# Patient Record
Sex: Female | Born: 1970 | Race: White | Hispanic: No | Marital: Single | Smoking: Never smoker
Health system: Southern US, Community
[De-identification: ages and names within clinical notes are randomized; demographics above are authoritative.]

## PROBLEM LIST (undated history)

## (undated) DIAGNOSIS — K589 Irritable bowel syndrome without diarrhea: Secondary | ICD-10-CM

## (undated) DIAGNOSIS — Z8739 Personal history of other diseases of the musculoskeletal system and connective tissue: Secondary | ICD-10-CM

## (undated) DIAGNOSIS — D649 Anemia, unspecified: Secondary | ICD-10-CM

## (undated) DIAGNOSIS — I359 Nonrheumatic aortic valve disorder, unspecified: Secondary | ICD-10-CM

## (undated) DIAGNOSIS — I341 Nonrheumatic mitral (valve) prolapse: Secondary | ICD-10-CM

## (undated) DIAGNOSIS — N809 Endometriosis, unspecified: Secondary | ICD-10-CM

## (undated) DIAGNOSIS — F419 Anxiety disorder, unspecified: Secondary | ICD-10-CM

## (undated) DIAGNOSIS — R000 Tachycardia, unspecified: Secondary | ICD-10-CM

## (undated) DIAGNOSIS — Z90710 Acquired absence of both cervix and uterus: Secondary | ICD-10-CM

## (undated) DIAGNOSIS — Z310 Encounter for reversal of previous sterilization: Secondary | ICD-10-CM

## (undated) DIAGNOSIS — C50919 Malignant neoplasm of unspecified site of unspecified female breast: Secondary | ICD-10-CM

## (undated) DIAGNOSIS — M1990 Unspecified osteoarthritis, unspecified site: Secondary | ICD-10-CM

## (undated) DIAGNOSIS — J309 Allergic rhinitis, unspecified: Secondary | ICD-10-CM

## (undated) DIAGNOSIS — I059 Rheumatic mitral valve disease, unspecified: Secondary | ICD-10-CM

## (undated) DIAGNOSIS — I493 Ventricular premature depolarization: Secondary | ICD-10-CM

## (undated) DIAGNOSIS — Q254 Congenital malformation of aorta unspecified: Secondary | ICD-10-CM

## (undated) DIAGNOSIS — G8929 Other chronic pain: Secondary | ICD-10-CM

## (undated) DIAGNOSIS — N946 Dysmenorrhea, unspecified: Secondary | ICD-10-CM

## (undated) DIAGNOSIS — F32A Depression, unspecified: Secondary | ICD-10-CM

## (undated) DIAGNOSIS — M7918 Myalgia, other site: Secondary | ICD-10-CM

## (undated) DIAGNOSIS — G43909 Migraine, unspecified, not intractable, without status migrainosus: Secondary | ICD-10-CM

## (undated) HISTORY — DX: Depression, unspecified: F32.A

## (undated) HISTORY — DX: Dysmenorrhea, unspecified: N94.6

## (undated) HISTORY — DX: Migraine, unspecified, not intractable, without status migrainosus: G43.909

## (undated) HISTORY — DX: Other chronic pain: G89.29

## (undated) HISTORY — DX: Personal history of other diseases of the musculoskeletal system and connective tissue: Z87.39

## (undated) HISTORY — DX: Anxiety disorder, unspecified: F41.9

## (undated) HISTORY — DX: Myalgia, other site: M79.18

## (undated) HISTORY — DX: Irritable bowel syndrome without diarrhea: K58.9

## (undated) HISTORY — DX: Unspecified osteoarthritis, unspecified site: M19.90

## (undated) HISTORY — PX: EXCISION OF ENDOMETRIOMA: SHX6473

## (undated) HISTORY — DX: Rheumatic mitral valve disease, unspecified: I05.9

## (undated) HISTORY — DX: Acquired absence of both cervix and uterus: Z90.710

## (undated) HISTORY — PX: ABDOMINAL HYSTERECTOMY: SHX81

## (undated) HISTORY — PX: ELBOW BURSA SURGERY: SHX615

## (undated) HISTORY — DX: Tachycardia, unspecified: R00.0

## (undated) HISTORY — DX: Malignant neoplasm of unspecified site of unspecified female breast: C50.919

## (undated) HISTORY — DX: Congenital malformation of aorta unspecified: Q25.40

## (undated) HISTORY — DX: Nonrheumatic mitral (valve) prolapse: I34.1

## (undated) HISTORY — DX: Nonrheumatic aortic valve disorder, unspecified: I35.9

## (undated) HISTORY — DX: Anemia, unspecified: D64.9

## (undated) HISTORY — DX: Ventricular premature depolarization: I49.3

## (undated) HISTORY — DX: Endometriosis, unspecified: N80.9

## (undated) HISTORY — DX: Encounter for reversal of previous sterilization: Z31.0

## (undated) HISTORY — DX: Allergic rhinitis, unspecified: J30.9

---

## 1996-01-12 HISTORY — PX: PLEURAL SCARIFICATION: SHX748

## 2000-07-29 ENCOUNTER — Emergency Department (HOSPITAL_COMMUNITY): Admission: EM | Admit: 2000-07-29 | Discharge: 2000-07-29 | Payer: Self-pay | Admitting: Emergency Medicine

## 2002-01-11 ENCOUNTER — Emergency Department (HOSPITAL_COMMUNITY): Admission: EM | Admit: 2002-01-11 | Discharge: 2002-01-11 | Payer: Self-pay | Admitting: Emergency Medicine

## 2003-07-11 ENCOUNTER — Emergency Department (HOSPITAL_COMMUNITY): Admission: EM | Admit: 2003-07-11 | Discharge: 2003-07-11 | Payer: Self-pay | Admitting: Emergency Medicine

## 2004-01-26 ENCOUNTER — Emergency Department (HOSPITAL_COMMUNITY): Admission: EM | Admit: 2004-01-26 | Discharge: 2004-01-26 | Payer: Self-pay | Admitting: Emergency Medicine

## 2004-05-31 ENCOUNTER — Emergency Department (HOSPITAL_COMMUNITY): Admission: EM | Admit: 2004-05-31 | Discharge: 2004-05-31 | Payer: Self-pay | Admitting: Family Medicine

## 2004-11-13 ENCOUNTER — Emergency Department (HOSPITAL_COMMUNITY): Admission: EM | Admit: 2004-11-13 | Discharge: 2004-11-13 | Payer: Self-pay | Admitting: Family Medicine

## 2004-11-26 ENCOUNTER — Emergency Department (HOSPITAL_COMMUNITY): Admission: EM | Admit: 2004-11-26 | Discharge: 2004-11-26 | Payer: Self-pay | Admitting: Emergency Medicine

## 2005-02-16 ENCOUNTER — Encounter: Payer: Self-pay | Admitting: Sports Medicine

## 2005-02-16 ENCOUNTER — Ambulatory Visit (HOSPITAL_COMMUNITY): Admission: RE | Admit: 2005-02-16 | Discharge: 2005-02-16 | Payer: Self-pay | Admitting: General Practice

## 2005-02-26 ENCOUNTER — Encounter: Admission: RE | Admit: 2005-02-26 | Discharge: 2005-02-26 | Payer: Self-pay | Admitting: Internal Medicine

## 2005-06-01 ENCOUNTER — Encounter: Payer: Self-pay | Admitting: Sports Medicine

## 2006-04-26 ENCOUNTER — Ambulatory Visit: Payer: Self-pay | Admitting: Sports Medicine

## 2006-04-26 DIAGNOSIS — M545 Low back pain, unspecified: Secondary | ICD-10-CM | POA: Insufficient documentation

## 2006-04-26 DIAGNOSIS — R51 Headache: Secondary | ICD-10-CM | POA: Insufficient documentation

## 2006-04-26 DIAGNOSIS — F329 Major depressive disorder, single episode, unspecified: Secondary | ICD-10-CM | POA: Insufficient documentation

## 2006-04-26 DIAGNOSIS — M5020 Other cervical disc displacement, unspecified cervical region: Secondary | ICD-10-CM | POA: Insufficient documentation

## 2006-04-26 DIAGNOSIS — M546 Pain in thoracic spine: Secondary | ICD-10-CM | POA: Insufficient documentation

## 2006-04-26 DIAGNOSIS — R519 Headache, unspecified: Secondary | ICD-10-CM | POA: Insufficient documentation

## 2006-04-26 DIAGNOSIS — F3289 Other specified depressive episodes: Secondary | ICD-10-CM | POA: Insufficient documentation

## 2006-05-12 ENCOUNTER — Telehealth: Payer: Self-pay | Admitting: *Deleted

## 2006-05-12 ENCOUNTER — Telehealth: Payer: Self-pay | Admitting: Sports Medicine

## 2006-05-13 ENCOUNTER — Ambulatory Visit: Payer: Self-pay | Admitting: Sports Medicine

## 2006-05-13 DIAGNOSIS — J019 Acute sinusitis, unspecified: Secondary | ICD-10-CM | POA: Insufficient documentation

## 2006-06-16 ENCOUNTER — Ambulatory Visit: Payer: Self-pay | Admitting: Family Medicine

## 2006-06-16 ENCOUNTER — Telehealth: Payer: Self-pay | Admitting: *Deleted

## 2007-01-12 DIAGNOSIS — N3010 Interstitial cystitis (chronic) without hematuria: Secondary | ICD-10-CM

## 2007-01-12 HISTORY — DX: Interstitial cystitis (chronic) without hematuria: N30.10

## 2008-10-25 ENCOUNTER — Ambulatory Visit
Admit: 2008-10-25 | Discharge: 2008-10-25 | Disposition: A | Discharge status: Home / Self Care | Payer: Self-pay | Source: Ambulatory Visit | Attending: Obstetrics | Admitting: Obstetrics

## 2009-01-15 ENCOUNTER — Ambulatory Visit: Payer: Self-pay | Admitting: Obstetrics

## 2009-02-14 ENCOUNTER — Ambulatory Visit: Payer: Self-pay | Admitting: Obstetrics

## 2009-03-14 ENCOUNTER — Ambulatory Visit: Payer: Self-pay | Admitting: Obstetrics

## 2009-03-14 ENCOUNTER — Encounter: Payer: Self-pay | Admitting: Gastroenterology

## 2009-03-16 NOTE — Progress Notes (Signed)
Reason For Visit   CHIEF COMPLAINT: Chronic abdominopelvic pain.     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  She has not been seen since October 2010.  The   current diagnoses are myofascial pain syndrome and possibly endometriosis.   At her last visit she had trigger point injections in the left lower   abdomen.  She returns for repeat trigger point injections.     Since her last visit her pain has increased somewhat. She rates her current   pain level as 3. In the past month her worst pain was 7, her least pain was   0, and her average pain level was 2..     Side effects due to her medications are none at the current time.  She uses   her medication only on a p.r.n. basis, primarily in the evening.  She is   oriented it will interfere with her classes if she takes it during the   daytime.     There appears to be no evidence of drug hoarding, diversion, or misuse.   There is no evidence of addictive behavior.     She is having significant problems with right hand pain secondary to bone   spurs.  She has a consultation set up with Dr. Felipa Evener a hand surgeon here   at the [facility].     Her interval medical history is otherwise unchanged.  Allergies   Cephalosporins.  Current Meds   Hydrocodone-Acetaminophen 5-325 MG Tablet;Take 1-2 tablet orally every 6   hours as needed for pain. MDD 6; Rx  Diazepam 10 MG Tablet;TAKE 1 TABLET EVERY 12 HOURS MDD 2; Rx  Lidoderm 5 % Patch;APPLY patch to painful area for 12 hrs per day; Rx.  Physical Exam   Weight 115 pounds.  Blood pressure 104/62.  Pulse 88.  Pain level 3/10.  She is not in acute distress.  Mood and affect are normal.  Contractions   are appropriate.  Sitting posture is normal.  Two trigger points were identified in the left lower quadrant.  Abdominal   examination was otherwise unremarkable.  Plan   Trigger point injections  were done.  No other changes to her current   management.  Next visit in 6 to 8 weeks.  Signature   Electronically signed by: Mickle Mallory  M.D.; 03/16/2009 9:43 AM EST.

## 2009-04-02 ENCOUNTER — Ambulatory Visit: Payer: Self-pay | Admitting: Orthopedic Surgery

## 2009-04-25 ENCOUNTER — Ambulatory Visit: Payer: Self-pay | Admitting: Obstetrics

## 2009-04-25 NOTE — Progress Notes (Signed)
Reason For Visit   CHIEF COMPLAINT: Chronic abdominopelvic pain.     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  The current diagnoses are myofascial pain syndrome   and possibly endometriosis. At her last visit she had trigger point   injections but did not have a sustained relief as previously.  She returns   for repeat trigger point injections with abdominal and back pain.     Since her last visit her pain has decreased somewhat. She rates her current   pain level as 3. In the past month her worst pain was 6, her least pain was   0, and her average pain level was 2.      She does have orthopedic surgery scheduled in the near future with Dr.   Felipa Evener.     Her interval medical history is otherwise unchanged.  Allergies   Cephalosporins.  Current Meds   Diazepam 10 MG Tablet;TAKE 1 TABLET EVERY 12 HOURS MDD 2; Rx  Hydrocodone-Acetaminophen 5-325 MG Tablet;Take 1-2 tablet orally every 6   hours as needed for pain. MDD 6; Rx  Lidoderm 5 % Patch;APPLY patch to painful area for 12 hrs per day; Rx.  Physical Exam   Weight 118 pounds.  Blood pressure 112/78.  Pulse 80.  Pain level 3.  A trigger point is identified in her left upper multifidus muscle.  A trigger point is identified in the left rectus abdominis muscle near its   insertion.  Plan   Trigger point injections were done.  Procedure note documents this.  Next   visit in 6 weeks.  Signature   Electronically signed by: Mickle Mallory  M.D.; 04/25/2009 1:03 PM EST.

## 2009-05-08 ENCOUNTER — Ambulatory Visit: Payer: Self-pay | Admitting: Orthopedic Surgery

## 2009-05-08 ENCOUNTER — Ambulatory Visit
Admit: 2009-05-08 | Discharge: 2009-05-08 | Disposition: A | Discharge status: Home / Self Care | Payer: Self-pay | Source: Ambulatory Visit | Attending: Orthopedic Surgery | Admitting: Orthopedic Surgery

## 2009-05-21 ENCOUNTER — Ambulatory Visit: Payer: Self-pay | Admitting: Obstetrics

## 2009-05-24 NOTE — Op Note (Signed)
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    OPERATIVE PROCEDURE:      Right basal joint arthroplasty with flexor carpi  radialis to abductor pollicis longus transfer and trapezial excision,  CPT-4: 16109, 60454.    ANESTHESIA: Local with sedation.    COMPLICATIONS:     None.    CONDITION:  Satisfactory.    ESTIMATED BLOOD LOSS:     Minimal.    INDICATIONS:  The patient has a history of right basal joint arthritis.  Risks, benefits, and alternatives of surgical excision.  She also had a  volar wrist ganglion which comes and goes located over the FCR  preoperative.  We did consent her for this; however, at the time of  surgery, the ganglion had completely resolved and the findings at the time  of surgery were consistent with FCR synovitis and likely this was fluid  tracking from the basal joint into the FCR.  For this reason, an incision  was not made over the area of the ganglion but it was approached from the  proximal portion of the thumb incision.    DESCRIPTION OF PROCEDURE:              The patient was brought in the  operating room.  After successful placement of local anesthesia and  sedation, the patient's right arm was prepped and draped in usual sterile  fashion.  The arm was exsanguinated, tourniquet inflated.  An incision was  made over the basal joint.  Dissection was carried down sharply through the  skin and bluntly through subcutaneous tissues.  Muscles were reflected  volarly and sensory nerves were protected and capsule was then incised.  The FCR was preserved.  Trapezium was then subperiosteally dissected and  removed in 1 piece.  Significant arthritis of the basal joint was noted and  osteophytes were noted proximally as well as a loose body.  The wound was  then  thoroughly irrigated.  FCR to APL transfer was performed putting the  thumb in appropriate position and tension.  Capsule was then repaired.  The  FCR was explored at this point proximally through the previous incision at  the level of the ganglion and no discrete ganglion but significant  synovitis of the FCR was noted.  The FCR was released and the synovitis  removed.  Hopefully this will resolve her volar wrist swelling.  The wound  was then closed with Monocryl, Mastisol, and Steri-Strips.  A sterile  dressing was applied including a thumb spica splint and the patient  tolerated the procedure well without complications.            Electronically Signed and Finalized  by  Tyler Pita, MD 05/24/2009 08:35  _____________________________________________  Tyler Pita, MD      DD:   05/08/2009  DT:   05/08/2009  7:40 P  DVI:  [id number]  WGN/FA2#[id number]    cc:   Tyler Pita, MD

## 2009-05-26 ENCOUNTER — Ambulatory Visit: Payer: Self-pay | Admitting: Orthopedic Surgery

## 2009-05-26 ENCOUNTER — Ambulatory Visit: Payer: Self-pay | Admitting: Rehabilitative and Restorative Service Providers"

## 2009-06-18 ENCOUNTER — Ambulatory Visit: Payer: Self-pay | Admitting: Obstetrics

## 2009-07-08 ENCOUNTER — Ambulatory Visit: Payer: Self-pay | Admitting: Orthopedic Surgery

## 2009-07-11 NOTE — Progress Notes (Signed)
HISTORY OF PRESENT ILLNESS:  Desiree Cunningham returns today about 8 weeks following  her right basal joint arthroplasty.  She is really coming along nicely.    PHYSICAL EXAMINATION:  On examination, she has full flexion and extension  of the fingers.  Good motion of her thumb IP and MCP joint.  Full  extension.  Her basal joint is stable, but tight.  She has negative  Finkelstein's.  She has minimal swelling around the scar.  There is some  scar tissue under the incision.    IMAGING STUDIES:  X-rays taken today and personally reviewed show excellent  arthroplasty.    IMPRESSION:  Status post right basal joint arthroplasty.    PLAN:  She is doing extremely well.  She will continue with therapy at  [facility].  We will see her back in 2 months to check her progress.      Dictated by:  Andria Frames, PA  Electronically Reviewed and Signed by  Andria Frames, PA 07/10/2009 14:09  Co-signature.    Electronically Signed and Finalized  by  Tyler Pita, MD 07/11/2009 10:51  ___________________________________________  Tyler Pita, MD      DD:   07/08/2009  DT:   07/09/2009 11:21 A  ZO/XW9#[id number]  [id number]    cc:   Ellard Artis, MD

## 2009-09-02 ENCOUNTER — Ambulatory Visit: Payer: Self-pay | Admitting: Orthopedic Surgery

## 2009-09-08 NOTE — Progress Notes (Signed)
CHIEF COMPLAINT: Bilateral basal joint arthritis.    INTERVAL HISTORY: The patient is here today. She is doing quite well from  her right basal joint arthroplasty performed on May 08, 2009. She has  been working through school and has been able to do adjustments.  She notes  that she is going to be starting a soft tissue class in the near future and  has some concerns with regards to that.    PHYSICAL EXAMINATION: On physical examination she has good healing.  Some  hypertrophic scarring is noted about her incision site.  She has excellent  stability and excellent alignment.  She has tenderness over the left basal  joint and a positive shoulder sign.    ASSESSMENT: Bilateral basal joint arthritis.    PLAN: I discussed in detail my findings with her.  At this point she is  making great progress.  I am quite concerned about her injuring her newly  repaired right thumb.  In discussion with her about trigger point  therapies, I really think she should avoid longitudinal stressing of her  thumb, as this would affect the ligamentous repair at this early stage.  Certainly modifying trigger point therapy by using the adjacent fingers or  the olecranon process would be acceptable.  In addition, given the  condition of her left thumb, similar restrictions would apply.                Electronically Signed and Finalized  by  Tyler Pita, MD 09/08/2009 11:24  ___________________________________________  Tyler Pita, MD      DD:   09/02/2009  DT:   09/03/2009 10:39 A  AVW/UJW#[id number]  [id number]    cc:

## 2009-11-14 ENCOUNTER — Ambulatory Visit: Payer: Self-pay | Admitting: Orthopedic Surgery

## 2009-11-29 NOTE — Progress Notes (Signed)
 INTERVAL HISTORY: The patient is here today for reevaluation of her right  thumb now more than 6 months out from basal joint arthroplasty.  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    PHYSICAL EXAMINATION: She is well and in no distress.  On examination of  the right hand, her basal joint incision has healed. She has excellent  motion through the [facility] joint and easily opposes to the small finger.  There  is some localized soft tissue swelling with tenderness overlying the STT  joint.  Palpation of this reproduces and exacerbates her symptoms. She is  not tender along the course of her 1st dorsal extensor compartment. She  does not have central or ulnar wrist discomfort.  She does not have a  positive Tinel sign overlying the medial nerve at her wrist.  Her  neurovascular exam is unremarkable.    X-RAYS: 3 views of the right thumb were obtained today showing sequela of  trapeziectomy.  There is good maintenance of the arthroplasty site.  There  are some benign trapezoidal cysts.  Otherwise there are no significant  degenerative changes or acute abnormalities within the carpus.    ASSESSMENT: Right STT joint arthritis.    PLAN: Today's findings were discussed with the patient and her partner.  Reassurance was given that there is good maintenance of the arthroplasty  site.  She is mostly tender overlying the STT joint and was offered a  cortisone injection today but declined.  She would rather treat this  conservatively with modalities offered in hand therapy.  She was provided  with a requisition for ultrasound and topical iontophoresis with  dexamethasone.  She will use a neoprene wrap and Voltaren gel to control  painful swelling. We are happy to see her in the future at her discretion.          Dictated by:  Lucrezia Starch, NP  Electronically Signed and Finalized  by  Lucrezia Starch, NP 11/29/2009 09:30  ___________________________________________  Lucrezia Starch, NP  DD:   11/26/2009  DT:   11/28/2009  4:56 A  NFA/OZH#[id number]  [id number]    cc:

## 2010-01-13 DIAGNOSIS — N949 Unspecified condition associated with female genital organs and menstrual cycle: Secondary | ICD-10-CM | POA: Insufficient documentation

## 2010-01-13 DIAGNOSIS — F411 Generalized anxiety disorder: Secondary | ICD-10-CM | POA: Insufficient documentation

## 2010-01-13 DIAGNOSIS — K589 Irritable bowel syndrome without diarrhea: Secondary | ICD-10-CM | POA: Insufficient documentation

## 2010-01-13 DIAGNOSIS — M129 Arthropathy, unspecified: Secondary | ICD-10-CM | POA: Insufficient documentation

## 2010-01-14 DIAGNOSIS — G43909 Migraine, unspecified, not intractable, without status migrainosus: Secondary | ICD-10-CM | POA: Insufficient documentation

## 2010-01-14 DIAGNOSIS — N946 Dysmenorrhea, unspecified: Secondary | ICD-10-CM | POA: Insufficient documentation

## 2010-01-16 ENCOUNTER — Ambulatory Visit: Payer: Self-pay | Admitting: Obstetrics

## 2010-01-16 NOTE — Progress Notes (Signed)
 Reason For Visit   Chronic abdominopelvic pain.  Back pain.  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  The current   diagnoses are myofascial pain syndrome and possibly endometriosis. she has   been treated previously with trigger point injections with mixed results.    Since I last saw her she has had physical therapy and chiropractic   treatment for trigger point located posteriorly in her external oblique   muscle just below the rib cage.  She still complains of a trigger point in   that location.  She has continued to use diazepam intermittently for   treatment of pelvic floor muscle pain.  She also occasionally takes   hydrocodone-acetaminophen for pain.  Lidoderm patches of also been   prescribed in the past.     Since her last visit her pain has increased somewhat. She rates her current   pain level as 4. In the past month her worst pain was 7, her least pain was   1, and her average pain level was 3..     Side effects due to her medications are none.     There appears to be no evidence of drug hoarding, diversion, or misuse.   There is no evidence of addictive behavior.     She had hand surgery since I last saw her.  She is healing well from that   and has resumed almost normal function.     Her interval medical history is otherwise unchanged.  Allergies   Cephalosporins  Kiwi; Dyspnea  Latex; Other  Levaquin TABS; Hives.  Current Meds   Lidoderm 5 % Patch;APPLY patch to painful area for 12 hrs per day; Rx  Non-medication order(s);Physical therapyMyofascial pain syndrome of   abeomen; Rx  Non-medication order(s);Dexamethasone Sodium Phosphate 4mg / ml injection.    Dispense 30ml multi dose vial; Rx  Lidocaine 5 % Ointment;Apply sparingly to painful area of abdomen and back   2 times per day.; Rx  Diazepam 10 MG Tablet;TAKE 1 TABLET EVERY 12 HOURS MDD 2; Rx  Hydrocodone-Acetaminophen 5-325 MG Tablet;Take 1-2 tablet  orally every 6   hours as needed for pain. MDD 6; Rx.  Active Problems   Anxiety (300.00); Stress  Arthritis (716.90)  Female Pelvic Pain (625.9)  Irritable Bowel Syndrome (564.1)  Migraine Headache (346.90)  Severe Menstrual Pain (Dysmenorrhea) (625.3).  Vital Signs   Recorded by [facility] on 16 Jan 2010 01:04 PM  BP:108/70,   HR: 72 b/min,   Weight: 120 lb,   Pain Scale: 2.  Physical Exam   Not in acute distress.  Normal mood and affect.  Normal interactions.  Trigger points identified on the left back just below the rib cage and the   external oblique muscle.  Plan   Trigger point injection was done.  Next visit in 6-8 weeks for repeat   trigger point injection.  Signature   Electronically signed by: Mickle Mallory  M.D.; 01/16/2010 4:45 PM EST.

## 2010-01-18 NOTE — Miscellaneous (Unsigned)
 Continuity of Care Record  Created: todo  From: ,   From:   From: [employer], EHR v10.2.7.53  To: Joyce, Earlyne  Purpose: Patient Use;       Problems  Diagnosis: Anxiety (300.00)   Diagnosis: Arthritis (716.90)   Diagnosis: Female Pelvic Pain (625.9)   Diagnosis: Irritable Bowel Syndrome (564.1)   Diagnosis: Migraine Headache (346.90)   Diagnosis: Severe Menstrual Pain (Dysmenorrhea) (625.3)     Family History  Paternal grandmother's history of Depression  Family history of Diabetes Mellitus (V18.0)   Family history of Hyperlipidemia  Family history of Hypertension (V17.49)   Family history of Osteoporosis (V17.81)   Family history of Stroke Syndrome (V17.1)   Family history of Substance Use Disorders  Maternal grandmother's history of Thyroid Disorder (V18.19)   Maternal history of Trichotillomania    Alerts  Allergy - Kiwi Dyspnea   Allergy - Levaquin TABS Hives   Allergy - Cephalosporins   Allergy - Latex Other     Medications  Diazepam 10 MG Tablet; TAKE 1 TABLET EVERY 12 HOURS MDD 2 ; Rx   Hydrocodone-Acetaminophen 5-325 MG Tablet; Take 1-2 tablet orally every 6   hours as needed for pain. MDD 6 ; Rx   Lidocaine 5 % Ointment; Apply sparingly to painful area of abdomen and back   2 times per day. ; Rx   Lidoderm 5 % Patch; APPLY patch to painful area for 12 hrs per day ; Rx   Non-medication order(s); Physical therapyMyofascial pain syndrome of   abeomen ; Rx   Non-medication order(s); Dexamethasone Sodium Phosphate 4mg / ml injection.    Dispense 30ml multi dose vial ; Rx

## 2010-01-18 NOTE — Progress Notes (Deleted)
 INTERVAL HISTORY:   The patient is here today for re-evaluation of her  right thumb now more than 6 months out from basal joint arthroplasty.  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    PHYSICAL EXAMINATION:  She is well and in no distress.  On examination of  the right hand, her basal joint incision has healed.  She has excellent  motion through the [facility] joint and easily opposes to the small finger.  There  is some localized soft tissue swelling with tenderness overlying the STT  joint.  Palpation of this reproduces and exacerbates her symptoms.  She is  not tender along the course of her 1st dorsal extensor compartment.  She  does not have central or ulnar wrist discomfort.  She does not have a  positive Tinel overlying the median nerve at her wrist.  Her neurovascular  exam is unremarkable.    X-RAYS:  Three views of the right thumb are obtained today showing sequelae  of the trapeziectomy.  There is good maintenance of the arthroplasty site.  There are some benign trapezoidal cysts; otherwise there are no significant  degenerative changes or acute abnormalities within the carpus.    ASSESSMENT:  Right STT joint arthritis.    PLAN:  Today's findings were discussed with the patient and her partner.  Reassurance was given that there is good maintenance of the arthroplasty  site.   She is mostly tender overlying the STT joint and was offered a  cortisone injection today but declined.  She would rather treat this  conservatively with modalities offered in hand therapy.  She was provided  with a requisition for ultrasound and topical iontophoresis with  dexamethasone.  She will use a neoprene wrap and Voltaren gel to control  painful swelling.  We are happy to see in the future at her discretion.        Dictated by:  Lucrezia Starch,  NP    Unreviewed  ___________________________________________  Lucrezia Starch, NP  DD:   11/26/2009  DT:   11/27/2009 12:41 P  CLB/le2#[id number]  [id number]    cc:

## 2010-02-12 NOTE — Progress Notes (Signed)
Summary: Pharmacy number to change med  Phone Note Call from Patient Call back at Home Phone [phone]   Summary of Call: pt states Dr Darrick Penna wanted [facility] pharmacy number to send in new rx - phone number [phone] Initial call taken by: Haydee Salter,  May 12, 2006 4:40 PM  Follow-up for Phone Call        called in neurontin 100mg  three times a day #90 no refill Follow-up by: Arlyss Repress CMA,,  May 12, 2006 5:18 PM

## 2010-02-12 NOTE — Progress Notes (Signed)
Summary: triage  Phone Note Call from Patient Call back at Home Phone [phone]   Reason for Call: Talk to Doctor Summary of Call: pt sts she has been seen in the past for her neck & has hurt her neck really bad, she wants to know if she should lye in the bed & take neurotin or vicodin? Initial call taken by: ERIN LEVAN,  June 16, 2006 10:02 AM  Follow-up for Phone Call        has bone spurs in neck and has rx for gabapentin, vicodin & muscle relaxants available.  Woke up this am with neck pain and inability to move neck to the left. Very sensative to meds and has not yet taken anything. Did play raquetball yesterday. Appt made for today. Can try ibuprofen if desired. states she may not be able to drive here if she takes any of the other meds Follow-up by: Golden Circle RN,  June 16, 2006 10:25 AM

## 2010-02-12 NOTE — Assessment & Plan Note (Signed)
Summary: fu wk   Vital Signs:  Patient Profile:   40 Years Old Female Weight:      110 pounds Pulse rate:   80 / minute BP sitting:   96 / 71  (right arm)  Pt. in pain?   yes    Location:   neck    Intensity:   5  Vitals Entered By: Arlyss Repress CMA, (May 13, 2006 11:13 AM)                Chief Complaint:  f/up neck. will start neurontin 100mg  tid and URI symptoms.  History of Present Illness: Pt returns for follow up of neck.  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  Currently takes Lexapro and Valium (10 mg about every 3rd day) for depression and insomnia.  Wants a lower dose due to studying for finals and wants to avoid somnolence.  After finals will be OK taking up to 300 mg at bedtime  Also c/o L sinus pain and pressure    URI Symptoms      This is a 40 year old woman who presents with URI symptoms.  The symptoms began duration > 3 days ago.  The severity is described as moderate.    URI Symptoms      The patient reports nasal congestion and dry cough, but denies purulent nasal discharge, sore throat, and earache.  The patient denies fever, dyspnea, and wheezing.  The patient also reports sneezing and headache.  The patient denies itchy watery eyes, muscle aches, and severe fatigue.  Risk factors for Strep sinusitis include unilateral facial pain.  The patient denies the following risk factors for Strep sinusitis: Strep exposure and tender adenopathy.    Sinus infection 3 months ago did not respond to Z-pak but resolved with Cipro course.        Physical Exam  General:     Well-developed,well-nourished,in no acute distress; alert,appropriate and cooperative throughout examination Head:     Normocephalic and atraumatic without obvious abnormalities. No apparent alopecia or balding.  Unilateral frontal and maxillary TTP on R. Eyes:     No corneal or conjunctival inflammation noted. EOMI. Perrla. Funduscopic exam benign, without  hemorrhages, exudates or papilledema. Vision grossly normal. Ears:     External ear exam shows no significant lesions or deformities.  Otoscopic examination reveals clear canals, tympanic membranes are intact bilaterally without bulging, retraction, inflammation or discharge. Hearing is grossly normal bilaterally. Nose:     External nasal examination shows no deformity or inflammation. Nasal mucosa are pink and moist without lesions or exudates. Mouth:     Oral mucosa and oropharynx without lesions or exudates.  Teeth in good repair. Neck:     No deformities, masses, or tenderness noted.  Spurlings test Positive for pain down R arm.  full ROM.      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  Will start with one at night and titrate up 100 mg every 2-3 days based on symptoms.  After finals will be OK with 300 at night and whatever tolerated during the day based on symptoms.  Avoid Valium as much as possible.  f/u as needed based on symptoms. Orders: [facility]- Est  Level 4 ([id number])   Problem # 2:  SINUSITIS, ACUTE NOS (ICD-461.9) Assessment: New Continue Nasal Spray.  Start augmentin 875/125 two times  a day x 10 days.   Avoid 2nd course of cipro due to tendon concerns. Orders: [facility]- Est  Level 4 (04540)
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
## 2010-02-12 NOTE — Progress Notes (Signed)
Summary: medication  Phone Note Call from Patient Call back at Home Phone [phone]   Reason for Call: Talk to Doctor Summary of Call: pt sts she was given an rx & was suppose to f/u with her doctor, pt sts she was given 300 mgs & that is too strong, she wants to see if she can get 100 mgs prescribed, pt goes to [facility] pharmacy & would like call whether or not it will be done Initial call taken by: ERIN LEVAN,  May 12, 2006 11:23 AM  Follow-up for Phone Call        [facility] to try 100 mgm dose at [facility] Follow-up by: Enid Baas MD,  May 12, 2006 3:41 PM

## 2010-02-12 NOTE — Letter (Signed)
Summary: [facility]  [facility]   Imported By: Fontaine No 05/31/2006 09:59:24  _____________________________________________________________________  External Attachment:    Type:   Image     Comment:   External Document

## 2010-02-12 NOTE — Assessment & Plan Note (Signed)
Summary: neck pain & reduced mobility as of this am   Vital Signs:  Patient Profile:   40 Years Old Female Weight:      108 pounds Temp:     99.0 degrees F Pulse rate:   79 / minute BP sitting:   103 / 75  (left arm)  Pt. in pain?   yes    Location:   neck    Intensity:   8  Vitals Entered By: Jacki Cones RN (June 16, 2006 1:51 PM)                Chief Complaint:  neck pain and stiffness.  History of Present Illness:  Woke and cannot extend neck after playing raquetball yesterday.  "Feel something is obstructing at level of C3."  Can rotate left of midline if she looks up. Pain 8/10 on pain scale. Had tingling radiating from her ociput to her trapezius that resolved.  Tried Motrin with some relief.    H/O Neck pain and low back pain.  Started seeing Dr. Darrick Penna please see office visit from 04/26/2006 & 05/13/2006 for full details.  Reports initial injury was last year.  Occured after she lifted a box of xerox paper.  Was in PT. Started on Neurontin but it made her very tired so she is waiting to start it.     Denies any sick contacts.  Considers herself to have had a fever because her temp is 98.9 and normal for her is 97. ROS otherwise negative       Risk Factors:  Tobacco use:  quit    Year quit:  2004    Physical Exam  General:     Well-developed,well-nourished,uncomfortable; alert,appropriate and cooperative throughout examination Head:     Normocephalic and atraumatic without obvious abnormalities. No apparent alopecia or balding. Neck:     Limited ROM.  Extension of only 5 degrees, Flexion to approximately 20 degrees. Full rotation to right but only approximately 20 degrees left of midline. TTP over C3 transverse process. DTR and neuro testing wnl  Lungs:     Normal respiratory effort, chest expands symmetrically. Lungs are clear to auscultation, no crackles or wheezes. Heart:     Normal rate and regular rhythm. S1 and S2 normal without gallop, murmur, click,  rub or other extra sounds.    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  Will start Motrin 600mg  three times a day and Flexeril 5mg  three times a day as needed.  both eprescribed.  Can take vicodin that she has as needed.  Can restart Neurontin.  FU with Dr. Darrick Penna.  Advised not to drive secondary to limited ROM and medications. Orders: [facility]- Est Level  3 (69629)

## 2010-02-27 ENCOUNTER — Encounter: Payer: Self-pay | Admitting: Obstetrics

## 2010-02-27 ENCOUNTER — Ambulatory Visit: Payer: Self-pay | Admitting: Obstetrics

## 2010-02-27 NOTE — Progress Notes (Signed)
 Reason For Visit   Chronic abdominopelvic pain.  Chronic left flank pain.  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  The current diagnoses are myofascial pain syndrome   and possibly endometriosis. she has been treated previously with trigger   point injections with mixed results.  Since I last saw her she has had   physical therapy and chiropractic treatment for trigger point located   posteriorly in her external oblique muscle just below the rib cage. at her   last visit this was injected with good pain relief.  She still complains of   a trigger point in that location.  She has continued to use diazepam   intermittently for treatment of pelvic floor muscle pain.  She also   occasionally takes hydrocodone-acetaminophen for pain.  Lidoderm ointment   also been prescribed in the past.     Since her last visit her pain has increased somewhat. She rates her current   pain level as 3-4. In the past month her worst pain was 6, her least pain   was 1, and her average pain level was 3..     Side effects due to her medications are none at the current time.     There appears to be no evidence of drug hoarding, diversion, or misuse.   There is no evidence of addictive behavior.     Her interval medical history is otherwise unchanged.  Allergies   Cephalosporins  Kiwi; Dyspnea  Latex; Other  Levaquin TABS; Hives.  Current Meds   Non-medication order(s);Physical therapyMyofascial pain syndrome of   abeomen; Rx  Lidocaine 5 % Ointment;Apply sparingly to painful area of abdomen and back   2 times per day.; Rx  Hydrocodone-Acetaminophen 5-325 MG Tablet;Take 1-2 tablet orally every 6   hours as needed for pain. MDD 6; Rx  Diazepam 10 MG Tablet;TAKE 1 TABLET EVERY 12 HOURS MDD 2; Rx  Non-medication order(s);Dexamethasone Sodium Phosphate 4mg / ml injection.    Dispense 30ml multi dose vial; Rx  Lidoderm 5  % Patch;APPLY patch to painful area for 12 hrs per day; Rx.  Active Problems   Anxiety (300.00); Stress  Arthritis (716.90)  Female Pelvic Pain (625.9)  Irritable Bowel Syndrome (564.1)  Migraine Headache (346.90)  Severe Menstrual Pain (Dysmenorrhea) (625.3).  PSH   Tonsillectomy; Resolved: 1987  Wrist Arthroplasty; Resolved: 2011.  Family Hx   Paternal grandmother's history of Depression  Family history of Diabetes Mellitus  Family history of Hyperlipidemia  Family history of Hypertension; Grandparents  Family history of Osteoporosis  Family history of Stroke Syndrome  Family history of Substance Use Disorders  Maternal grandmother's history of Thyroid Disorder  Maternal history of Trichotillomania.  Vital Signs   Recorded by RAUF,SADAF on 27 Feb 2010 01:32 PM  BP:112/74,  RUE,  Sitting,   HR: 76 b/min,  R Radial, Regular,   Weight: 123 lb,   Pain Scale: 3.  Physical Exam   Not in acute distress.  Normal mood and affect.  Well-groomed.  Not sedated.  There is a trigger point on the left and the intercostal space just   posterior to mid axillary line.  Another trigger point was localized to the   left multifidus muscle.  No other trigger points were identified.  Plan   No changes were made to medical management.  Trigger point injections were   done.  Procedure was completed.  Next visit in 6-8 weeks.  Signature   Electronically signed by: Mickle Mallory  M.D.; 02/27/2010 4:28 PM EST.

## 2010-05-08 ENCOUNTER — Ambulatory Visit: Payer: Self-pay | Admitting: Obstetrics

## 2010-06-03 ENCOUNTER — Other Ambulatory Visit: Payer: Self-pay

## 2010-06-03 DIAGNOSIS — R102 Pelvic and perineal pain: Secondary | ICD-10-CM

## 2010-06-03 MED ORDER — DIAZEPAM 10 MG PO TABS *I*
ORAL_TABLET | ORAL | Status: DC
Start: 2010-06-03 — End: 2010-08-21

## 2010-06-03 NOTE — Telephone Encounter (Signed)
 Pt. Lm requesting presc. For valium 10 mg.   Last presc: 02/27/10  Last appt: 02/27/10  Next appt.: 08/21/10  Wants presc. Mailed.

## 2010-08-18 ENCOUNTER — Telehealth: Payer: Self-pay | Admitting: Orthopedic Surgery

## 2010-08-18 NOTE — Telephone Encounter (Signed)
I will fax script over.

## 2010-08-21 ENCOUNTER — Encounter: Payer: Self-pay | Admitting: Obstetrics

## 2010-08-21 ENCOUNTER — Ambulatory Visit: Payer: Self-pay | Admitting: Obstetrics

## 2010-08-21 VITALS — BP 112/81 | HR 57 | Ht 64.5 in | Wt 124.0 lb

## 2010-08-21 DIAGNOSIS — N94819 Vulvodynia, unspecified: Secondary | ICD-10-CM | POA: Insufficient documentation

## 2010-08-21 DIAGNOSIS — M7918 Myalgia, other site: Secondary | ICD-10-CM

## 2010-08-21 DIAGNOSIS — R102 Pelvic and perineal pain: Secondary | ICD-10-CM

## 2010-08-21 DIAGNOSIS — G8929 Other chronic pain: Secondary | ICD-10-CM

## 2010-08-21 MED ORDER — HYDROCODONE-ACETAMINOPHEN 5-325 MG PO TABS *I*
ORAL_TABLET | ORAL | Status: DC
Start: 2010-08-21 — End: 2011-01-15

## 2010-08-21 MED ORDER — DIAZEPAM 10 MG PO TABS *I*
ORAL_TABLET | ORAL | Status: DC
Start: 2010-08-21 — End: 2010-11-13

## 2010-08-21 MED ORDER — LIDOCAINE 5 % EX OINT *I*
TOPICAL_OINTMENT | CUTANEOUS | Status: DC
Start: 2010-08-21 — End: 2011-01-15

## 2010-08-21 NOTE — Progress Notes (Signed)
CHIEF COMPLAINT:  Follow up pelvic pain    PRESENT ILLNESS  Desiree Cunningham is a 40 y.o. female who is in today for continued management of chronic abdominopelvic pain.  Her current diagnoses are:      Patient Active Problem List   Diagnoses Code   . Arthritis 716.90   . Anxiety 300.00   . Irritable Bowel Syndrome 564.1   . Female Pelvic Pain 625.9   . Severe Menstrual Pain (Dysmenorrhea) 625.3   . Migraine Headache 346.90   . Myofascial pain syndrome 729.1   . 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 DISCONTD: diazepam (VALIUM) 10 MG tablet Take 1 tablet every 12 hours  MDD:2  60 tablet  0        Past Surgical History   Procedure Date   . Converted procedure      Wrist Arthroplasty Conversion Data    . Hx tonsillectomy/adenoidectomy      Tonsillectomy Conversion Data         History reviewed. No pertinent past medical history.     Allergies   Allergen Reactions   . Cephalosporins      Created by Conversion - 0;    . Kiwi Extract      Created by Conversion - Dyspnea;    . Latex      Created by Conversion - Other; Clear pustules contact dermatitis mild on hands   . Levaquin      Created by Conversion - Hives;          Since her last visit her pain has not significantly changed. She rates her current pain level as 4 out of 10.  In the past month her worst pain was 7/10, her least pain was 0/10, and her average pain level was 3/10.       Side effects due to her medications are:   Nausea none  Vomiting none  Constipation none  Itching none  Mental cloudiness none  Sweating none  Fatigue none  Drowsiness none.      There appears to be no evidence of drug hoarding, diversion, or misuse. There is no evidence of addictive behavior.     Her interval medical history is otherwise unchanged.    REVIEW OF SYSTEMS  Negative except for the following complaints:  Abdominal pain and bloating.  Urinary incontinence  it appears to be positional.  Back pain.  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    PHYSICAL EXAM   BP 112/81  Pulse 57  Ht 5' 4.5" (1.638 m)  Wt 124 lb (56.246 kg)  BMI 20.96 kg/m2  LMP 08/05/2010   Pain    08/21/10 1438   PainSc:   4   PainLoc: Back         General: Well-appearing, well-groomed female in no apparent distress.  She does not appear sedated.  Her interactions are appropriate.  Her affect is normal.  Her mood is normal.  trigger points identified leg costal margin on the left.  This is toward her back about 5 cm lateral to the midline.      ASSESSMENT/PLAN    40 y.o. female with chronic abdomino-pelvic pain:   Patient Active Problem List   Diagnoses Code   . Arthritis 716.90   . Anxiety 300.00   . Irritable Bowel Syndrome 564.1   .  Female Pelvic Pain 625.9   . Severe Menstrual Pain (Dysmenorrhea) 625.3   . Migraine Headache 346.90   . Myofascial pain syndrome 729.1   . 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  Trigger point injection was done.  Medications were refilled.Marland Kitchen Next visit in 8-12 weeks weeks.

## 2010-08-21 NOTE — Ambulatory Procedure (Signed)
Identifier x2 was done.  Verbal consent was obtained.  Trigger point as identified about the ninth the 10th intercostal space.  Trigger point injection was done with 1 mL of 0.25% bupivacaine with good pain relief.  No adverse effects.

## 2010-11-13 ENCOUNTER — Other Ambulatory Visit: Payer: Self-pay

## 2010-11-13 DIAGNOSIS — R102 Pelvic and perineal pain: Secondary | ICD-10-CM

## 2010-11-13 DIAGNOSIS — G8929 Other chronic pain: Secondary | ICD-10-CM

## 2010-11-13 MED ORDER — DIAZEPAM 10 MG PO TABS *I*
ORAL_TABLET | ORAL | Status: DC
Start: 2010-11-13 — End: 2011-01-15

## 2010-11-13 NOTE — Telephone Encounter (Signed)
Message from pt needing Rx for valium.  Last Rx given at appt 08/21/10, next appt is 1/4.  Pt will send envelopes to [facility] with her pharmacy's address.

## 2011-01-06 ENCOUNTER — Ambulatory Visit: Payer: Self-pay | Admitting: Obstetrics

## 2011-01-13 DIAGNOSIS — N946 Dysmenorrhea, unspecified: Secondary | ICD-10-CM

## 2011-01-13 DIAGNOSIS — G43909 Migraine, unspecified, not intractable, without status migrainosus: Secondary | ICD-10-CM

## 2011-01-13 DIAGNOSIS — K589 Irritable bowel syndrome without diarrhea: Secondary | ICD-10-CM

## 2011-01-13 DIAGNOSIS — F411 Generalized anxiety disorder: Secondary | ICD-10-CM

## 2011-01-15 ENCOUNTER — Encounter: Payer: Self-pay | Admitting: Obstetrics

## 2011-01-15 ENCOUNTER — Ambulatory Visit: Payer: Self-pay | Admitting: Obstetrics

## 2011-01-15 VITALS — BP 108/70 | Ht 65.0 in | Wt 123.0 lb

## 2011-01-15 DIAGNOSIS — R102 Pelvic and perineal pain: Secondary | ICD-10-CM

## 2011-01-15 DIAGNOSIS — M7918 Myalgia, other site: Secondary | ICD-10-CM

## 2011-01-15 DIAGNOSIS — G8929 Other chronic pain: Secondary | ICD-10-CM

## 2011-01-15 MED ORDER — LIDOCAINE 5 % EX OINT *I*
TOPICAL_OINTMENT | CUTANEOUS | Status: DC
Start: 2011-01-15 — End: 2011-04-26

## 2011-01-15 MED ORDER — DIAZEPAM 10 MG PO TABS *I*
ORAL_TABLET | ORAL | Status: DC
Start: 2011-01-15 — End: 2011-03-08

## 2011-01-15 MED ORDER — HYDROCODONE-ACETAMINOPHEN 5-325 MG PO TABS *I*
ORAL_TABLET | ORAL | Status: DC
Start: 2011-01-15 — End: 2011-06-04

## 2011-03-08 ENCOUNTER — Ambulatory Visit: Payer: Self-pay | Admitting: Obstetrics and Gynecology

## 2011-03-08 ENCOUNTER — Encounter: Payer: Self-pay | Admitting: Obstetrics and Gynecology

## 2011-03-08 VITALS — BP 108/68 | Ht 65.5 in | Wt 126.0 lb

## 2011-03-08 DIAGNOSIS — G8929 Other chronic pain: Secondary | ICD-10-CM

## 2011-03-08 DIAGNOSIS — M7918 Myalgia, other site: Secondary | ICD-10-CM

## 2011-03-08 DIAGNOSIS — R102 Pelvic and perineal pain: Secondary | ICD-10-CM

## 2011-03-08 DIAGNOSIS — F411 Generalized anxiety disorder: Secondary | ICD-10-CM

## 2011-03-08 DIAGNOSIS — N949 Unspecified condition associated with female genital organs and menstrual cycle: Secondary | ICD-10-CM

## 2011-03-08 MED ORDER — DIAZEPAM 10 MG PO TABS *I*
ORAL_TABLET | ORAL | Status: DC
Start: 2011-03-08 — End: 2011-06-04

## 2011-03-08 NOTE — Progress Notes (Signed)
CHIEF COMPLAINT:  Follow up abdominopelvic pain    PRESENT ILLNESS  Desiree Cunningham is a 41 y.o. female who is in today for continued management of chronic abdominopelvic and back pain.  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         fluticasone (FLONASE) 50 MCG/ACT nasal spray 1 spray by Nasal route daily          lidocaine (XYLOCAINE) 5 % ointment Apply sparingly to painful area of abdomen and back 2 times per day.  37.5 g  3    HYDROcodone-acetaminophen (NORCO) 5-325 MG per tablet Take 1-2 tablet orally every 6 hours as needed for pain. MDD 6  180 tablet  1    DISCONTD: diazepam (VALIUM) 10 MG tablet Take 1 tablet every 12 hours  MDD:2  60 tablet  0    escitalopram (LEXAPRO) 5 MG tablet Take 5 mg by mouth daily (with dinner)            loratadine (CLARITIN) 10 MG tablet Take 10 mg by mouth daily            SUMAtriptan (IMITREX) 25 MG tablet Take 25 mg by mouth as needed   Take at onset of headache. May repeat once in 2 hours.             Past Surgical History   Procedure Date    Hx tonsillectomy/adenoidectomy 1987    Wrist arthroplasty 2011        Past Medical History   Diagnosis Date    IBS (irritable bowel syndrome)     Migraine headache     Arthritis     History of fibromyalgia     Mental disorder      anxiety, depression           Allergies   Allergen Reactions    Latex Dermatitis    Kiwi Extract Shortness Of Breath    Cephalosporins     Levaquin Hives         Over the past month, the intensity of her pain is:      Very much improved           Much improved   x  Minimally improved           Not changed         Minimally worse      Much worse     Very much worse       Her average pain level in the past month has been 4 out of 10.     Her level of physical activity and mobility over the  past month has been Fair .    Over the past month she has felt that she needed additional pain medication ? every 3 days    Since her last office visit, she has gone to the emergency room for pain 0 times.    The quality of her sleep in the past month has been Poor    Her mood in the past month has been Fair     Side effects of her medications for pain in the past month have been mild.  Her side effects are itching.      There appears to be no evidence of drug hoarding, diversion, or misuse. There is no evidence of addictive behavior.     Her interval medical history is otherwise unchanged.         REVIEW OF SYSTEMS  Negative except for the following complaints:  Incomplete bladder emptying    PHYSICAL EXAM   BP 108/68  Ht 5' 5.5" (1.664 m)  Wt 126 lb (57.153 kg)  BMI 20.65 kg/m2   Pain    03/08/11 1438   PainSc:   4         General: Well-appearing, well-groomed female in no apparent distress.  She does not appear sedated.  Her interactions are appropriate.  Her affect is normal.  Her mood is normal.  Abd: not indicated  Pelvic: not indicated  Back:            ASSESSMENT/PLAN    41 y.o. 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 TPI done. Next visit in 6 weeks.

## 2011-04-26 ENCOUNTER — Encounter: Payer: Self-pay | Admitting: Obstetrics and Gynecology

## 2011-04-26 ENCOUNTER — Ambulatory Visit: Payer: Self-pay | Admitting: Obstetrics and Gynecology

## 2011-04-26 VITALS — BP 118/70 | Ht 65.0 in | Wt 123.8 lb

## 2011-04-26 DIAGNOSIS — R102 Pelvic and perineal pain: Secondary | ICD-10-CM

## 2011-04-26 DIAGNOSIS — M7918 Myalgia, other site: Secondary | ICD-10-CM

## 2011-04-26 DIAGNOSIS — G8929 Other chronic pain: Secondary | ICD-10-CM

## 2011-04-26 MED ORDER — LIDOCAINE 5 % EX OINT *I*
TOPICAL_OINTMENT | CUTANEOUS | Status: DC
Start: 2011-04-26 — End: 2011-12-20

## 2011-04-26 NOTE — Progress Notes (Signed)
CHIEF COMPLAINT:  Follow up abdominopelvic pain    PRESENT ILLNESS  Desiree Cunningham is a 41 y.o. female who is in today for continued management of chronic abdominopelvic pain.  Her current diagnoses are:      Patient Active Problem List   Diagnoses Date Noted   . Chronic pelvic pain in female 03/08/2011     Pain med agreement signed 10/2008  Norco  Valium     . Myofascial pain syndrome 08/21/2010     Left multifidus muscle  TPI q 6 wks     . 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 Severe Menstrual Pain (Dysmenorrhea) 01/14/2010   . Migraine Headache 01/14/2010   . Arthritis 01/13/2010   . Anxiety 01/13/2010     Lexapro     . Irritable bowel syndrome 01/13/2010        Current medications and treatment consists of:  Current Outpatient Prescriptions on File Prior to Visit   Medication Sig Dispense Refill   . diazepam (VALIUM) 10 MG tablet Take 1 tablet every 12 hours  MDD:2  60 tablet  0   . diclofenac (VOLTAREN) 1 % gel Apply topically 4 times daily   Apply 4 g to affected area 4 times daily.        . fluticasone (FLONASE) 50 MCG/ACT nasal spray 1 spray by Nasal route daily         . lidocaine (XYLOCAINE) 5 % ointment Apply sparingly to painful area of abdomen and back 2 times per day.  37.5 g  3   . HYDROcodone-acetaminophen (NORCO) 5-325 MG per tablet Take 1-2 tablet orally every 6 hours as needed for pain. MDD 6  180 tablet  1   . escitalopram (LEXAPRO) 5 MG tablet Take 5 mg by mouth daily (with dinner)           . loratadine (CLARITIN) 10 MG tablet Take 10 mg by mouth daily           . SUMAtriptan (IMITREX) 25 MG tablet Take 25 mg by mouth as needed   Take at onset of headache. May repeat once in 2 hours.             Past Surgical History   Procedure Date   . Hx tonsillectomy/adenoidectomy 1987   . Wrist arthroplasty 2011        Past Medical History   Diagnosis Date   . IBS (irritable bowel syndrome)    . Migraine headache    . Arthritis    . History of fibromyalgia    . Mental disorder      anxiety, depression        Allergies      Allergen Reactions   . Latex Dermatitis   . Kiwi Extract Shortness Of Breath   . Cephalosporins    . Levaquin Hives         Over the past month, the intensity of her pain is:       Very much improved            Much improved   x  Minimally improved            Not changed          Minimally worse       Much worse      Very much worse       Her average pain level in the past month has been 4 out of 10.     Her level of physical activity and  mobility over the past month has been Fair .    Over the past month she has felt that she needed additional pain medication ? every 3 days    Since her last office visit, she has gone to the emergency room for pain 0 times.    The quality of her sleep in the past month has been Poor    Her mood in the past month has been Fair     Side effects of her medications for pain in the past month have been none.  Her side effects are none.      There appears to be no evidence of drug hoarding, diversion, or misuse. There is no evidence of addictive behavior.     Her interval medical history is otherwise unchanged.         REVIEW OF SYSTEMS  Negative except for the following complaints:  Headaches  Fatigue  Straining during a bowel movement  Urinary leakage  Incomplete bladder emptying  Stress  Back pain  Areas of numbness/tingling    PHYSICAL EXAM   BP 118/70  Ht 5\' 5"  (1.651 m)  Wt 123 lb 12 oz (56.133 kg)  BMI 20.59 kg/m2  LMP 04/16/2011   Pain    04/26/11 1342   PainSc:   2   PainLoc: Pelvic         General: Well-appearing, well-groomed female in no apparent distress.  She does not appear sedated.  Her interactions are appropriate.  Her affect is normal.  Her mood is normal.  Back:          ASSESSMENT/PLAN    41 y.o. female with chronic abdomino-pelvic pain:   Patient Active Problem List   Diagnoses Code   . Arthritis 716.90   . Anxiety 300.00   . Irritable bowel syndrome 564.1   . Severe Menstrual Pain (Dysmenorrhea) 625.3   . Migraine Headache 346.90   . Myofascial pain syndrome  729.1   . 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 Chronic pelvic pain in female 625.9        Management plan: no changes made. TPI done. Next visit in 6 weeks.

## 2011-04-26 NOTE — Procedures (Signed)
Trigger Point Injections    Reason for injection: Myofascial pain with trigger points    Area to be injected:    Abdomen: no   Back: yes   Pelvic floor: no    Location of trigger points:          Number of trigger points injected: 2    Number of muscles involved: 2    Anesthetic Agent: Bupivacaine 0.25%    Total Volume of Local Anesthetic: 2 ml    Pre-Injection Pain Level: 2    Post-Injection Pain Level: 0    Adverse Reaction: no    Annalaura Sauseda BENJAMINPRATT, MD

## 2011-05-27 ENCOUNTER — Telehealth: Payer: Self-pay

## 2011-05-27 NOTE — Telephone Encounter (Signed)
Tc from pt requesting refill of valium.  Pt mentioned that her next appt is in June, it is actually 5/24.  Happy to hear that because feels like she needs trigger points sooner than June.  Pt then said she has enough medication to last her until appt.  Will request Rx then.

## 2011-06-04 ENCOUNTER — Ambulatory Visit: Payer: Self-pay | Admitting: Obstetrics and Gynecology

## 2011-06-04 ENCOUNTER — Encounter: Payer: Self-pay | Admitting: Obstetrics and Gynecology

## 2011-06-04 VITALS — BP 108/70 | HR 72 | Ht 66.0 in | Wt 125.0 lb

## 2011-06-04 DIAGNOSIS — M7918 Myalgia, other site: Secondary | ICD-10-CM

## 2011-06-04 DIAGNOSIS — R102 Pelvic and perineal pain: Secondary | ICD-10-CM

## 2011-06-04 DIAGNOSIS — G8929 Other chronic pain: Secondary | ICD-10-CM

## 2011-06-04 MED ORDER — HYDROCODONE-ACETAMINOPHEN 5-325 MG PO TABS *I*
ORAL_TABLET | ORAL | Status: DC
Start: 2011-06-04 — End: 2011-08-24

## 2011-06-04 MED ORDER — DIAZEPAM 10 MG PO TABS *I*
ORAL_TABLET | ORAL | Status: DC
Start: 2011-06-04 — End: 2011-08-24

## 2011-06-04 NOTE — Procedures (Signed)
Trigger Point Injections    Reason for injection: Myofascial pain with trigger points    Area to be injected:    Abdomen: no   Back: yes   Pelvic floor: no    Location of trigger points:          Number of trigger points injected: 2    Number of muscles involved: 2    Anesthetic Agent: Bupivacaine 0.25%    Total Volume of Local Anesthetic: 4 ml    Pre-Injection Pain Level:   Pain    06/04/11 1444   PainSc:   5   PainLoc: Pelvic         Post-Injection Pain Level: 0    Adverse Reaction: no    Desiree Cunningham BENJAMINPRATT, MD

## 2011-06-04 NOTE — Progress Notes (Signed)
CHIEF COMPLAINT:  Follow up abdominopelvic pain    PRESENT ILLNESS  Desiree Cunningham is a 41 y.o. female who is in today for continued management of chronic abdominopelvic and back pain.  Her current diagnoses are:      Patient Active Problem List    Diagnosis Date Noted   . Chronic pelvic pain in female 03/08/2011     Pain med agreement signed 10/2008  Norco  Valium     . Myofascial pain syndrome 08/21/2010     Bilateral multifidus muscles, left greater than right  TPI q 6 wks  Lidocaine ointment     . 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 Severe Menstrual Pain (Dysmenorrhea) 01/14/2010   . Migraine Headache 01/14/2010   . Arthritis 01/13/2010   . Anxiety 01/13/2010     Lexapro     . Irritable bowel syndrome 01/13/2010        Current medications and treatment consists of:  Current Outpatient Prescriptions on File Prior to Visit   Medication Sig Dispense Refill   . lidocaine (XYLOCAINE) 5 % ointment Apply sparingly to painful area of abdomen and back 2 times per day.  37.5 g  3   . DISCONTD: diazepam (VALIUM) 10 MG tablet Take 1 tablet every 12 hours  MDD:2  60 tablet  0   . diclofenac (VOLTAREN) 1 % gel Apply topically 4 times daily   Apply 4 g to affected area 4 times daily.        . fluticasone (FLONASE) 50 MCG/ACT nasal spray 1 spray by Nasal route daily         . escitalopram (LEXAPRO) 5 MG tablet Take 5 mg by mouth daily (with dinner)           . loratadine (CLARITIN) 10 MG tablet Take 10 mg by mouth daily           . SUMAtriptan (IMITREX) 25 MG tablet Take 25 mg by mouth as needed   Take at onset of headache. May repeat once in 2 hours.          No current facility-administered medications on file prior to visit.        Past Surgical History   Procedure Laterality Date   . Hx tonsillectomy/adenoidectomy  1987   . Wrist arthroplasty  2011        Past Medical History   Diagnosis Date   . IBS (irritable bowel syndrome)    . Migraine headache    . Arthritis    . History of fibromyalgia    . Mental disorder      anxiety, depression        Allergies   Allergen Reactions   . Latex Dermatitis   . Kiwi Extract Shortness Of Breath   . Cephalosporins    . Levaquin Hives         Over the past month, the intensity of her pain is:       Very much improved            Much improved      Minimally improved            Not changed       x  Minimally worse       Much worse      Very much worse       Her average pain level in the past month has been 4 out of 10.     Her level of physical activity and mobility over the  past month has been Poor.    Over the past month she has felt that she needed additional pain medication ? every 3 days    Since her last office visit, she has gone to the emergency room for pain 0 times.    The quality of her sleep in the past month has been Fair     Her mood in the past month has been Good     Side effects of her medications for pain in the past month have been none.  Her side effects are none.      There appears to be no evidence of drug hoarding, diversion, or misuse. There is no evidence of addictive behavior.     Her interval medical history is otherwise unchanged.         REVIEW OF SYSTEMS  Negative except for the following complaints:  Breast lumps  Stress urinary incontinence  Incomplete emptying  Muscle pain and weakness      PHYSICAL EXAM   BP 108/70  Pulse 72  Ht 5\' 6"  (1.676 m)  Wt 125 lb (56.7 kg)  BMI 20.19 kg/m2  LMP 05/12/2011   Pain    06/04/11 1444   PainSc:   5   PainLoc: Pelvic     General: Well-appearing, well-groomed female in no apparent distress.  She does not appear sedated.  Her interactions are appropriate.  Her affect is normal.  Her mood is normal.  Back: Trigger points:            ASSESSMENT/PLAN    41 y.o. female with chronic abdomino-pelvic pain:   Patient Active Problem List   Diagnosis Code   . Arthritis 716.90   . Anxiety 300.00   . Irritable bowel syndrome 564.1   . Severe Menstrual Pain (Dysmenorrhea) 625.3   . Migraine Headache 346.90   . Myofascial pain syndrome 729.1   . Vulvodynia  625.70   . Chronic pelvic pain in female 625.9        Management plan:   TPI done.  Rx given  Next visit in 6 weeks.    This note was done with the use of a template.

## 2011-07-19 ENCOUNTER — Ambulatory Visit: Payer: Self-pay | Admitting: Obstetrics and Gynecology

## 2011-08-24 ENCOUNTER — Encounter: Payer: Self-pay | Admitting: Obstetrics and Gynecology

## 2011-08-24 ENCOUNTER — Ambulatory Visit: Payer: Self-pay | Admitting: Obstetrics and Gynecology

## 2011-08-24 VITALS — BP 94/72 | Ht 65.25 in | Wt 124.0 lb

## 2011-08-24 LAB — DRUG SCREEN CHEMICAL DEPENDENCY, URINE
Amphetamine,UR: NEGATIVE
Benzodiazepinen,UR: POSITIVE
Cocaine/Metab,UR: NEGATIVE
Opiates,UR: POSITIVE
THC Metabolite,UR: NEGATIVE

## 2011-08-24 MED ORDER — HYDROCODONE-ACETAMINOPHEN 5-325 MG PO TABS *I*
ORAL_TABLET | ORAL | Status: DC
Start: 2011-08-24 — End: 2012-02-07

## 2011-08-24 MED ORDER — DIAZEPAM 10 MG PO TABS *I*
ORAL_TABLET | ORAL | Status: DC
Start: 2011-08-24 — End: 2012-04-10

## 2011-08-24 NOTE — Procedures (Signed)
Trigger Point Injections    Reason for injection: Myofascial pain with trigger points    Area to be injected:    Abdomen: no   Back: yes   Pelvic floor: no    Location of trigger points:          Number of trigger points injected: 2    Number of muscles involved: 2    Anesthetic Agent: Bupivacaine 0.25%    Total Volume of Local Anesthetic: 2 ml    Pre-Injection Pain Level:  Pain    08/24/11 1345   PainSc:   4       Post-Injection Pain Level: 1    Adverse Reaction: no    Desiree Cunningham BENJAMINPRATT, MD

## 2011-08-24 NOTE — Progress Notes (Signed)
CHIEF COMPLAINT   Follow up abdominopelvic pain      PRESENT ILLNESS  Desiree Cunningham is a 41 y.o. female who is in today for continued management of chronic abdominopelvic pain.      Her current diagnoses are:    Patient Active Problem List    Diagnosis Date Noted   . Chronic pelvic pain in female 03/08/2011     Pain med agreement signed 10/2008  Norco  Valium     . Myofascial pain syndrome 08/21/2010     Bilateral multifidus muscles, left greater than right  TPI q 6 wks  Lidocaine ointment     . 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 Severe Menstrual Pain (Dysmenorrhea) 01/14/2010   . Migraine Headache 01/14/2010   . Arthritis 01/13/2010   . Anxiety 01/13/2010     Lexapro     . Irritable bowel syndrome 01/13/2010          Over the past month, the intensity of her pain is:      Very much improved           Much improved   x  Minimally improved           Not changed         Minimally worse      Much worse     Very much worse       Her average pain level in the past month has been 3 out of 10.     Her level of physical activity and mobility over the past month has been Fair .    Over the past month she has felt that she needed additional pain medication ? biweekly    Since her last office visit, she has gone to the emergency room for pain 0 times.    The quality of her sleep in the past month has been Fair     Her mood in the past month has been Good     Side effects of her medications for pain in the past month have been none.  Her side effects are none.      There appears to be no evidence of drug hoarding, diversion, or misuse. There is no evidence of addictive behavior.     Her interval medical history is otherwise unchanged.         REVIEW OF SYSTEMS  Negative except for the following complaints:  Headaches  Urinary leakage  Muscle and back pain      CURRENT MEDICATIONS   Current Outpatient Prescriptions on File Prior to Visit   Medication Sig Dispense Refill   . diazepam (VALIUM) 10 MG tablet Take 1 tablet every 12 hours  MDD:2  60  tablet  0   . lidocaine (XYLOCAINE) 5 % ointment Apply sparingly to painful area of abdomen and back 2 times per day.  37.5 g  3   . diclofenac (VOLTAREN) 1 % gel Apply topically 4 times daily   Apply 4 g to affected area 4 times daily.        . fluticasone (FLONASE) 50 MCG/ACT nasal spray 1 spray by Nasal route daily         . escitalopram (LEXAPRO) 5 MG tablet Take 5 mg by mouth daily (with dinner)           . loratadine (CLARITIN) 10 MG tablet Take 10 mg by mouth daily           . SUMAtriptan (IMITREX) 25 MG tablet Take 25 mg  by mouth as needed   Take at onset of headache. May repeat once in 2 hours.          No current facility-administered medications on file prior to visit.          ALLERGIES  Allergies   Allergen Reactions   . Latex Dermatitis   . Kiwi Extract Shortness Of Breath   . Cephalosporins    . Levaquin Hives         Past medical, surgical, and social history were reviewed and updated in the medical record.      PHYSICAL EXAM   BP 94/72  Ht 5' 5.25" (1.657 m)  Wt 124 lb (56.246 kg)  BMI 20.49 kg/m2  LMP 08/02/2011   Pain    08/24/11 1345   PainSc:   4     General: Well-appearing, well-groomed female in no apparent distress.    Psych: She does not appear sedated.  Her interactions are appropriate.  Her affect is normal.  Her mood is normal.   Back: trigger points as below        Pelvic: not indicated      ASSESSMENT/PLAN    41 y.o. female with chronic abdomino-pelvic pain: Patient Active Problem List   Diagnosis Code   . Arthritis 716.90   . Anxiety 300.00   . Irritable bowel syndrome 564.1   . Severe Menstrual Pain (Dysmenorrhea) 625.3   . Migraine Headache 346.90   . Myofascial pain syndrome 729.1   . 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 Chronic pelvic pain in female 625.9        TPi done  No med changes.  Rx diazepam and hydrocodone given.      Next visit in 6 weeks.     This note was done with the use of a template.

## 2011-08-27 LAB — CONFIRM OPIATES: Confirm Opiates: POSITIVE

## 2011-08-27 LAB — BENZODIAZEPINE, CONFIRMATION, URINE: Confirm BZD: POSITIVE

## 2011-10-12 ENCOUNTER — Ambulatory Visit: Payer: Self-pay | Admitting: Obstetrics and Gynecology

## 2011-10-12 ENCOUNTER — Encounter: Payer: Self-pay | Admitting: Obstetrics and Gynecology

## 2011-10-12 VITALS — BP 90/62 | Ht 65.5 in | Wt 125.0 lb

## 2011-10-12 DIAGNOSIS — G8929 Other chronic pain: Secondary | ICD-10-CM

## 2011-10-12 DIAGNOSIS — R102 Pelvic and perineal pain: Secondary | ICD-10-CM

## 2011-10-12 DIAGNOSIS — M7918 Myalgia, other site: Secondary | ICD-10-CM

## 2011-10-12 MED ORDER — CLONAZEPAM 2 MG PO TABS *I*
2.0000 mg | ORAL_TABLET | Freq: Two times a day (BID) | ORAL | Status: DC
Start: 2011-10-12 — End: 2011-11-16

## 2011-10-25 NOTE — Procedures (Signed)
Trigger Point Injections    Reason for injection: Myofascial pain with trigger points    Area to be injected:    Abdomen: no   Back: yes   Pelvic floor: no    Location of trigger points:          Number of trigger points injected: 3    Number of muscles involved: 3    Anesthetic Agent: Bupivacaine 0.25%    Total Volume of Local Anesthetic: 3 ml    Pre-Injection Pain Level:  Pain    10/12/11 1441   PainSc:   6   PainLoc: Back       Post-Injection Pain Level: 2    Adverse Reaction: no    Twanisha Foulk BENJAMINPRATT, MD

## 2011-10-25 NOTE — Progress Notes (Signed)
CHIEF COMPLAINT   Follow up abdominopelvic pain      PRESENT ILLNESS  Desiree Cunningham is a 41 y.o. female who is in today for continued management of chronic abdominopelvic pain.      Her current diagnoses are:    Patient Active Problem List    Diagnosis Date Noted   . Chronic pelvic pain in female 03/08/2011     Pain med agreement signed 10/2008  Norco  Valium     . Myofascial pain syndrome 08/21/2010     Bilateral multifidus muscles, left greater than right  TPI q 6 wks  Lidocaine ointment     . 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 Severe Menstrual Pain (Dysmenorrhea) 01/14/2010   . Migraine Headache 01/14/2010   . Arthritis 01/13/2010   . Anxiety 01/13/2010     Lexapro     . Irritable bowel syndrome 01/13/2010          Over the past month, the intensity of her pain is:      Very much improved           Much improved     Minimally improved           Not changed       x  Minimally worse - which she feels is due to increased time sitting     Much worse     Very much worse    Average pain level: 4 out of 10.     Level of physical activity and mobility: Good .    Has felt that she needed additional pain medication: every 2 days.    Since her last office visit, she has gone to the emergency room for pain 0 times.    Quality of her sleep in the past month: Fair .    Mood in the past month: Good .    Side effects of her medications for pain in the past month have been none.    There appears to be no evidence of drug hoarding, diversion, or misuse. There is no evidence of addictive behavior.     Her interval medical history is otherwise unchanged.        REVIEW OF SYSTEMS  Negative except for the following complaints:  Abdominal pain  Urinary leakage  Incomplete bladder emptying  Stress  Muscle pain        CURRENT MEDICATIONS   Current Outpatient Prescriptions on File Prior to Visit   Medication Sig Dispense Refill   . diazepam (VALIUM) 10 MG tablet Take 1 tablet every 12 hours  MDD:2  60 tablet  0   . HYDROcodone-acetaminophen (NORCO)  5-325 MG per tablet Take 1-2 tablet orally every 6 hours as needed for pain. MDD 6  180 tablet  1   . lidocaine (XYLOCAINE) 5 % ointment Apply sparingly to painful area of abdomen and back 2 times per day.  37.5 g  3   . diclofenac (VOLTAREN) 1 % gel Apply topically 4 times daily   Apply 4 g to affected area 4 times daily.        . fluticasone (FLONASE) 50 MCG/ACT nasal spray 1 spray by Nasal route daily         . escitalopram (LEXAPRO) 5 MG tablet Take 5 mg by mouth daily (with dinner)           . loratadine (CLARITIN) 10 MG tablet Take 10 mg by mouth daily           . SUMAtriptan (  IMITREX) 25 MG tablet Take 25 mg by mouth as needed   Take at onset of headache. May repeat once in 2 hours.          No current facility-administered medications on file prior to visit.          ALLERGIES  Allergies   Allergen Reactions   . Latex Dermatitis   . Kiwi Extract Shortness Of Breath   . Cephalosporins    . Levaquin Hives         Past medical, surgical, and social history were reviewed and updated in the medical record.      PHYSICAL EXAM   BP 90/62  Ht 1.664 m (5' 5.5")  Wt 56.7 kg (125 lb)  BMI 20.48 kg/m2  LMP 09/23/2011   Pain    10/12/11 1441   PainSc:   6   PainLoc: Back     General: Well-appearing, well-groomed female in no apparent distress.    Psych: She does not appear sedated.  Her interactions are appropriate.  Her affect is normal.  Her mood is normal.   Abdomen: trigger points as below      Pelvic: not indicated      ASSESSMENT/PLAN    41 y.o. female with chronic abdomino-pelvic pain: Patient Active Problem List   Diagnosis Code   . Arthritis 716.90   . Anxiety 300.00   . Irritable bowel syndrome 564.1   . Severe Menstrual Pain (Dysmenorrhea) 625.3   . Migraine Headache 346.90   . Myofascial pain syndrome 729.1   . 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 Chronic pelvic pain in female 625.9        TPI done.  No other changes.      Next visit in 6 weeks.     This note was done with the use of a template.

## 2011-11-16 ENCOUNTER — Encounter: Payer: Self-pay | Admitting: Obstetrics and Gynecology

## 2011-11-16 ENCOUNTER — Ambulatory Visit: Payer: Self-pay | Admitting: Obstetrics and Gynecology

## 2011-11-16 VITALS — BP 102/64 | Ht 65.0 in | Wt 122.0 lb

## 2011-11-16 DIAGNOSIS — G8929 Other chronic pain: Secondary | ICD-10-CM

## 2011-11-16 DIAGNOSIS — R102 Pelvic and perineal pain: Secondary | ICD-10-CM

## 2011-11-16 DIAGNOSIS — M7918 Myalgia, other site: Secondary | ICD-10-CM

## 2011-11-16 MED ORDER — CLONAZEPAM 2 MG PO TABS *I*
2.0000 mg | ORAL_TABLET | Freq: Two times a day (BID) | ORAL | Status: DC
Start: 2011-11-16 — End: 2012-02-07

## 2011-11-30 NOTE — Procedures (Signed)
Trigger Point Injections    Reason for injection: Myofascial pain with trigger points    Area to be injected:    Abdomen: no   Back: yes   Pelvic floor: no    Location of trigger points:          Number of trigger points injected: 6    Number of muscles involved: 4    Anesthetic Agent: Bupivacaine 0.25%    Total Volume of Local Anesthetic: 6 ml    Pre-Injection Pain Level:  Pain    11/16/11 1429   PainSc:   3   PainLoc: Pelvic       Post-Injection Pain Level: 1    Adverse Reaction: no    Evamae Rowen BENJAMINPRATT, MD

## 2011-12-01 NOTE — Progress Notes (Signed)
CHIEF COMPLAINT   Follow up abdominopelvic pain      PRESENT ILLNESS  Diaja is a 41 y.o. female who is in today for continued management of chronic abdominopelvic and back pain.      Her current diagnoses are:    Patient Active Problem List    Diagnosis Date Noted   . Chronic pelvic pain in female 03/08/2011     Pain med agreement signed 10/2008  Norco  Valium     . Myofascial pain syndrome 08/21/2010     Bilateral multifidus muscles, left greater than right  TPI q 6 wks  Lidocaine ointment     . 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 Severe Menstrual Pain (Dysmenorrhea) 01/14/2010   . Migraine Headache 01/14/2010   . Arthritis 01/13/2010   . Anxiety 01/13/2010     Lexapro     . Irritable bowel syndrome 01/13/2010          Over the past month, the intensity of her pain is minimally improved.     Average pain level: 3-4 out of 10.     Level of physical activity and mobility: Good .    Has felt that she needed additional pain medication: every 3 days.    Since her last office visit, she has gone to the emergency room for pain 0 times.    Quality of her sleep in the past month: Poor.    Mood in the past month: Fair .    Side effects of her medications for pain in the past month have been none.    There appears to be no evidence of drug hoarding, diversion, or misuse. There is no evidence of addictive behavior.     Her interval medical history is otherwise unchanged.        REVIEW OF SYSTEMS  Negative except for the following complaints:  Weight loss  Fatigue  Abdominal pain  Urinary leakage  Back pain      CURRENT MEDICATIONS   Current Outpatient Prescriptions on File Prior to Visit   Medication Sig Dispense Refill   . amoxicillin-clavulanate (AUGMENTIN) 500-125 MG per tablet Take 1 tablet by mouth 3 times daily       . diazepam (VALIUM) 10 MG tablet Take 1 tablet every 12 hours  MDD:2  60 tablet  0   . HYDROcodone-acetaminophen (NORCO) 5-325 MG per tablet Take 1-2 tablet orally every 6 hours as needed for pain. MDD 6  180 tablet   1   . lidocaine (XYLOCAINE) 5 % ointment Apply sparingly to painful area of abdomen and back 2 times per day.  37.5 g  3   . diclofenac (VOLTAREN) 1 % gel Apply topically 4 times daily   Apply 4 g to affected area 4 times daily.        . fluticasone (FLONASE) 50 MCG/ACT nasal spray 1 spray by Nasal route daily         . escitalopram (LEXAPRO) 5 MG tablet Take 5 mg by mouth daily (with dinner)           . loratadine (CLARITIN) 10 MG tablet Take 10 mg by mouth daily           . SUMAtriptan (IMITREX) 25 MG tablet Take 25 mg by mouth as needed   Take at onset of headache. May repeat once in 2 hours.          No current facility-administered medications on file prior to visit.  ALLERGIES  Allergies   Allergen Reactions   . Latex Dermatitis   . Kiwi Extract Shortness Of Breath   . Cephalosporins    . Levaquin Hives         Past medical, surgical, and social history were reviewed and updated in the medical record.      PHYSICAL EXAM   BP 102/64  Ht 5\' 5"  (1.651 m)  Wt 122 lb (55.339 kg)  BMI 20.3 kg/m2  LMP 11/13/2011   Pain    11/16/11 1429   PainSc:   3   PainLoc: Pelvic     General: Well-appearing, well-groomed female in no apparent distress.    Psych: She does not appear sedated.  Her interactions are appropriate.  Her affect is normal.  Her mood is normal.   Back: trigger points as noted below:        Pelvic: not indicated      ASSESSMENT/PLAN    41 y.o. female with chronic abdomino-pelvic pain: Patient Active Problem List   Diagnosis Code   . Arthritis 716.90   . Anxiety 300.00   . Irritable bowel syndrome 564.1   . Severe Menstrual Pain (Dysmenorrhea) 625.3   . Migraine Headache 346.90   . Myofascial pain syndrome 729.1   . 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 Chronic pelvic pain in female 625.9        TPI done  No other changes.      Next visit in 6 weeks.     This note was done with the use of a template.

## 2011-12-20 ENCOUNTER — Encounter: Payer: Self-pay | Admitting: Obstetrics and Gynecology

## 2011-12-20 ENCOUNTER — Ambulatory Visit: Payer: Self-pay | Admitting: Obstetrics and Gynecology

## 2011-12-20 VITALS — BP 100/62 | Ht 65.0 in | Wt 122.0 lb

## 2011-12-20 DIAGNOSIS — M7918 Myalgia, other site: Secondary | ICD-10-CM

## 2011-12-20 DIAGNOSIS — R102 Pelvic and perineal pain: Secondary | ICD-10-CM

## 2011-12-20 MED ORDER — LIDOCAINE 5 % EX OINT *I*
TOPICAL_OINTMENT | CUTANEOUS | Status: DC
Start: 2011-12-20 — End: 2012-12-29

## 2011-12-20 NOTE — Procedures (Signed)
Trigger Point Injections    Reason for injection: Myofascial pain with trigger points    Area to be injected:    Abdomen: no   Back: yes   Pelvic floor: no    Location of trigger points:          Number of trigger points injected: 6    Number of muscles involved: 4    Anesthetic Agent: Bupivacaine 0.25%    Total Volume of Local Anesthetic: 6 ml    Pre-Injection Pain Level:  Pain    12/20/11 0911   PainSc:   2       Post-Injection Pain Level: 0    Adverse Reaction: no    Desiree Merced BENJAMINPRATT, MD

## 2011-12-20 NOTE — Progress Notes (Signed)
CHIEF COMPLAINT   Follow up abdominopelvic and back pain      PRESENT ILLNESS  Desiree Cunningham is a 41 y.o. female who is in today for continued management of chronic abdominopelvic pain.  She is here for trigger point injections as well.     Her current diagnoses are:    Patient Active Problem List    Diagnosis Date Noted   . Chronic pelvic pain in female 03/08/2011     Pain med agreement signed 10/2008  Norco  Valium     . Myofascial pain syndrome 08/21/2010     Bilateral multifidus muscles, left greater than right  TPI q 6 wks  Lidocaine ointment     . 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 Severe Menstrual Pain (Dysmenorrhea) 01/14/2010   . Migraine Headache 01/14/2010   . Arthritis 01/13/2010   . Anxiety 01/13/2010     Lexapro     . Irritable bowel syndrome 01/13/2010          Over the past month, the intensity of her pain has slightly improved.    Average pain level: 3 out of 10.     Level of physical activity and mobility: Good .    Has felt that she needed additional pain medication: every 3 days.    Since her last office visit, she has gone to the emergency room for pain 0 times.    Quality of her sleep in the past month: Poor.    Mood in the past month: Fair .    Side effects of her medications for pain in the past month have been mild - not listed.    Evidence of drug hoarding, diversion, or misuse: No  Evidence of addictive behavior: No     Her interval medical history is otherwise unchanged.        REVIEW OF SYSTEMS  All systems reviewed and negative except:  Headaches  Urinary leakage  Anxiety  Decreased libido      CURRENT MEDICATIONS   Current Outpatient Prescriptions on File Prior to Visit   Medication Sig Dispense Refill   . clonazePAM (KLONOPIN) 2 MG tablet Take 1 tablet (2 mg total) by mouth 2 times daily   MDD 4 mg  60 tablet  0   . amoxicillin-clavulanate (AUGMENTIN) 500-125 MG per tablet Take 1 tablet by mouth 3 times daily       . diazepam (VALIUM) 10 MG tablet Take 1 tablet every 12 hours  MDD:2  60 tablet  0      . HYDROcodone-acetaminophen (NORCO) 5-325 MG per tablet Take 1-2 tablet orally every 6 hours as needed for pain. MDD 6  180 tablet  1   . lidocaine (XYLOCAINE) 5 % ointment Apply sparingly to painful area of abdomen and back 2 times per day.  37.5 g  3   . diclofenac (VOLTAREN) 1 % gel Apply topically 4 times daily   Apply 4 g to affected area 4 times daily.        . fluticasone (FLONASE) 50 MCG/ACT nasal spray 1 spray by Nasal route daily         . escitalopram (LEXAPRO) 5 MG tablet Take 5 mg by mouth daily (with dinner)           . loratadine (CLARITIN) 10 MG tablet Take 10 mg by mouth daily           . SUMAtriptan (IMITREX) 25 MG tablet Take 25 mg by mouth as needed   Take at onset  of headache. May repeat once in 2 hours.          No current facility-administered medications on file prior to visit.          ALLERGIES  Allergies   Allergen Reactions   . Latex Dermatitis   . Kiwi Extract Shortness Of Breath   . Cephalosporins    . Levaquin Hives         Past medical, surgical, and social history were reviewed and updated in the medical record.      PHYSICAL EXAM   BP 100/62  Ht 5\' 5"  (1.651 m)  Wt 122 lb (55.339 kg)  BMI 20.3 kg/m2  LMP 12/12/2011   Pain    12/20/11 0911   PainSc:   2     General: Well-appearing, well-groomed female in no apparent distress.    Psych: She does not appear sedated.  Her interactions are appropriate.  Her affect is normal.  Her mood is normal.   Torso:      Pelvic: not indicated      ASSESSMENT/PLAN    41 y.o. female with chronic abdominopelvic pain with the following diagnoses:  Patient Active Problem List   Diagnosis Code   . Arthritis 716.90   . Anxiety 300.00   . Irritable bowel syndrome 564.1   . Severe Menstrual Pain (Dysmenorrhea) 625.3   . Migraine Headache 346.90   . Myofascial pain syndrome 729.1   . 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 Chronic pelvic pain in female 625.9        TPi done.  Lidocaine refilled.      Next visit in 5 weeks.     This note was done with the use of a  template.

## 2012-01-12 HISTORY — PX: OOPHORECTOMY: SHX6387

## 2012-01-12 HISTORY — PX: ABDOMINAL HYSTERECTOMY: SUR658

## 2012-01-31 ENCOUNTER — Ambulatory Visit: Payer: Self-pay | Admitting: Obstetrics and Gynecology

## 2012-02-07 ENCOUNTER — Ambulatory Visit: Payer: Self-pay | Admitting: Obstetrics and Gynecology

## 2012-02-07 ENCOUNTER — Encounter: Payer: Self-pay | Admitting: Obstetrics and Gynecology

## 2012-02-07 VITALS — BP 108/70 | Ht 65.0 in | Wt 118.0 lb

## 2012-02-07 DIAGNOSIS — R102 Pelvic and perineal pain: Secondary | ICD-10-CM

## 2012-02-07 DIAGNOSIS — G8929 Other chronic pain: Secondary | ICD-10-CM

## 2012-02-07 DIAGNOSIS — Z01419 Encounter for gynecological examination (general) (routine) without abnormal findings: Secondary | ICD-10-CM

## 2012-02-07 MED ORDER — CLONAZEPAM 2 MG PO TABS *I*
2.0000 mg | ORAL_TABLET | Freq: Two times a day (BID) | ORAL | Status: DC
Start: 2012-02-07 — End: 2012-04-10

## 2012-02-07 MED ORDER — HYDROCODONE-ACETAMINOPHEN 5-325 MG PO TABS *I*
ORAL_TABLET | ORAL | Status: DC
Start: 2012-02-07 — End: 2012-04-10
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
## 2012-02-08 LAB — CHLAMYDIA PLASMID DNA AMPLIFICATION: Chlamydia Plasmid DNA Amplification: 0

## 2012-02-08 LAB — N. GONORRHOEAE DNA AMPLIFICATION: N. gonorrhoeae DNA Amplification: 0

## 2012-03-22 ENCOUNTER — Ambulatory Visit: Payer: Self-pay | Admitting: Obstetrics and Gynecology

## 2012-04-10 ENCOUNTER — Ambulatory Visit: Payer: Self-pay | Admitting: Obstetrics and Gynecology

## 2012-04-10 ENCOUNTER — Encounter: Payer: Self-pay | Admitting: Obstetrics and Gynecology

## 2012-04-10 VITALS — BP 104/70 | Ht 65.0 in | Wt 116.0 lb

## 2012-04-10 DIAGNOSIS — R102 Pelvic and perineal pain: Secondary | ICD-10-CM

## 2012-04-10 DIAGNOSIS — M7918 Myalgia, other site: Secondary | ICD-10-CM

## 2012-04-10 DIAGNOSIS — G8929 Other chronic pain: Secondary | ICD-10-CM

## 2012-04-10 MED ORDER — HYDROCODONE-ACETAMINOPHEN 5-325 MG PO TABS *I*
ORAL_TABLET | ORAL | Status: DC
Start: 2012-04-10 — End: 2012-08-28

## 2012-04-10 MED ORDER — DIAZEPAM 10 MG PO TABS *I*
ORAL_TABLET | ORAL | Status: DC
Start: 2012-04-10 — End: 2012-06-13

## 2012-04-10 MED ORDER — NORETHINDRONE ACETATE 5 MG PO TABS *I*
5.0000 mg | ORAL_TABLET | Freq: Every day | ORAL | Status: DC
Start: 2012-04-10 — End: 2012-06-26

## 2012-04-10 NOTE — Procedures (Signed)
Trigger Point Injections    Reason for injection: Myofascial pain with trigger points    Area to be injected:    Abdomen: no   Back: yes   Pelvic floor: no    Location of trigger points:          Number of trigger points injected: 6    Number of muscles involved: 6    Anesthetic Agent: Bupivacaine 0.25%    Total Volume of Local Anesthetic: 6 ml    Pre-Injection Pain Level:  Pain    04/10/12 0921   PainSc:   4   PainLoc: Pelvic       Post-Injection Pain Level: 0    Adverse Reaction: no    Hermon Zea, MD

## 2012-04-10 NOTE — Progress Notes (Signed)
CHIEF COMPLAINT   Follow up abdominopelvic pain      PRESENT ILLNESS  Desiree Cunningham is a 42 y.o. female who is in today for continued management of chronic abdominopelvic pain.  She had a very painful period recently and is wondering what she can use to help with that pain.     Her current diagnoses are:    Patient Active Problem List    Diagnosis Date Noted   . Chronic pelvic pain in female 03/08/2011     Pain med agreement signed 10/2008  Norco  Valium     . Myofascial pain syndrome 08/21/2010     Bilateral multifidus muscles, left greater than right  TPI q 6 wks  Lidocaine ointment     . 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 Severe Menstrual Pain (Dysmenorrhea) 01/14/2010   . Migraine Headache 01/14/2010   . Arthritis 01/13/2010   . Anxiety 01/13/2010     Lexapro     . Irritable bowel syndrome 01/13/2010          Over the past month, the intensity of her pain has worsened slightly.    Average pain level: 4 out of 10.     Level of physical activity and mobility: Fair .    Has felt that she needed additional pain medication: every 3 days.    Since her last office visit, she has gone to the emergency room for pain 0 times.    Quality of her sleep in the past month: Fair .    Mood in the past month: Fair .    Side effects of her medications for pain in the past month have been none.    Evidence of drug hoarding, diversion, or misuse: No  Evidence of addictive behavior: No     Her interval medical history is otherwise unchanged.      REVIEW OF SYSTEMS  All systems reviewed and negative except:  Diarrhea  Urinary leakage  Depression and anxiety  Joint and back pain      CURRENT MEDICATIONS   Current Outpatient Prescriptions on File Prior to Visit   Medication Sig Dispense Refill   . lidocaine (XYLOCAINE) 5 % ointment Apply sparingly to painful area of abdomen and back 2 times per day.  37.5 g  5   . diclofenac (VOLTAREN) 1 % gel Apply topically 4 times daily   Apply 4 g to affected area 4 times daily.        . fluticasone (FLONASE) 50  MCG/ACT nasal spray 1 spray by Nasal route daily         . escitalopram (LEXAPRO) 5 MG tablet Take 5 mg by mouth daily (with dinner)           . loratadine (CLARITIN) 10 MG tablet Take 10 mg by mouth daily           . SUMAtriptan (IMITREX) 25 MG tablet Take 25 mg by mouth as needed   Take at onset of headache. May repeat once in 2 hours.          No current facility-administered medications on file prior to visit.          ALLERGIES  Allergies   Allergen Reactions   . Latex Dermatitis   . Kiwi Extract Shortness Of Breath   . Cephalosporins    . Levaquin Hives         Past medical, surgical, and social history were reviewed and updated in the medical record.  PHYSICAL EXAM   BP 104/70  Ht 5\' 5"  (1.651 m)  Wt 116 lb (52.617 kg)  BMI 19.3 kg/m2  LMP 03/25/2012   Pain    04/10/12 1610   PainSc:   4   PainLoc: Pelvic       General: Well-appearing, well-groomed female in no apparent distress.      Psych: She does not appear sedated.  Her interactions are appropriate.  Her affect is normal.  Her mood is normal.     Back: trigger points as below            ASSESSMENT/PLAN    42 y.o. female with chronic abdominopelvic pain with the following diagnoses:  Patient Active Problem List   Diagnosis Code   . Arthritis 716.90   . Anxiety 300.00   . Irritable bowel syndrome 564.1   . Severe Menstrual Pain (Dysmenorrhea) 625.3   . Migraine Headache 346.90   . Myofascial pain syndrome 729.1   . 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 Chronic pelvic pain in female 625.9        TPI done  Try norethindrone for suppression  Switch back to Valium from Klonipin  Valium and Norco refilled.      Next visit in 6 weeks.     This note was done with the use of a template.

## 2012-04-22 ENCOUNTER — Telehealth: Payer: Self-pay | Admitting: Obstetrics and Gynecology

## 2012-04-22 NOTE — Telephone Encounter (Signed)
Patient was on Norethindrone for endometriosis ,she went to [place] and she got gastroenteritis she saw her PCP  .she has bee in Imitrex 25  Mg for Migraine ,Dr Shawnie Pons put her on Norethindrone 5 mg .she was not taking the Norethindrone because of the Imitrex by advise from her PCP but she is bleeding and she had endometriosis so she  Is asking if itis ok to start the Norethindrone since this not cause the migraine and she had all the work up before and both conditions the migraine @  The endometriosis are a chronic thing and Dr Shawnie Pons is  Aware of both conditions ,patient given the ok to start Norethindrone and call the office on Monday

## 2012-05-22 ENCOUNTER — Ambulatory Visit: Payer: Self-pay | Admitting: Obstetrics and Gynecology

## 2012-06-13 ENCOUNTER — Other Ambulatory Visit: Payer: Self-pay

## 2012-06-13 DIAGNOSIS — G8929 Other chronic pain: Secondary | ICD-10-CM

## 2012-06-13 DIAGNOSIS — R102 Pelvic and perineal pain: Secondary | ICD-10-CM

## 2012-06-13 NOTE — Telephone Encounter (Signed)
Pt was in the area and stopped in to request a refill on Valium 10mg . Would like it mailed to [facility] in [place].

## 2012-06-13 NOTE — Telephone Encounter (Signed)
Last filled Rx 3/31  Last appt 3/31  Next appt is 6/16  Mail to pharmacy

## 2012-06-14 MED ORDER — DIAZEPAM 10 MG PO TABS *I*
ORAL_TABLET | ORAL | Status: DC
Start: 2012-06-13 — End: 2012-08-28

## 2012-06-15 NOTE — Telephone Encounter (Signed)
Script mailed to [facility] in [place].

## 2012-06-26 ENCOUNTER — Ambulatory Visit: Payer: Self-pay | Admitting: Obstetrics and Gynecology

## 2012-06-26 ENCOUNTER — Encounter: Payer: Self-pay | Admitting: Obstetrics and Gynecology

## 2012-06-26 VITALS — BP 108/70 | Ht 65.0 in | Wt 117.0 lb

## 2012-06-26 MED ORDER — MEDROXYPROGESTERONE ACETATE 5 MG PO TABS *A*
5.0000 mg | ORAL_TABLET | Freq: Every day | ORAL | Status: DC
Start: 2012-06-26 — End: 2012-10-24

## 2012-06-26 NOTE — Procedures (Signed)
Trigger Point Injections    Reason for injection: Myofascial pain with trigger points    Area to be injected:    Abdomen: yes   Back: yes   Pelvic floor: no    Location of trigger points:          Number of trigger points injected: 4    Number of muscles involved: 4    Anesthetic Agent: Bupivacaine 0.25%    Total Volume of Local Anesthetic: 5 ml    Pre-Injection Pain Level:  Pain    06/26/12 1407   PainSc:   4   PainLoc: Pelvic       Post-Injection Pain Level: 1    Adverse Reaction: no    Allyanna Appleman, MD

## 2012-06-26 NOTE — Progress Notes (Signed)
CHIEF COMPLAINT   Follow up abdominopelvic pain      PRESENT ILLNESS  Desiree Cunningham is a 42 y.o. female who is in today for continued management of chronic abdominopelvic and back pain.  Normally she has trigger point injections in her back only, but she has been having enough pelvic pain that she would like one done in her abdomen as well.      She tried norethindrone but had diarrhea and had to stop it.     Her current diagnoses are:    Patient Active Problem List    Diagnosis Date Noted   . Chronic pelvic pain in female 03/08/2011     Pain med agreement signed 10/2008  Norco  Valium     . Myofascial pain syndrome 08/21/2010     Bilateral multifidus muscles, left greater than right  TPI q 6 wks  Lidocaine ointment     . 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 Severe Menstrual Pain (Dysmenorrhea) 01/14/2010   . Migraine Headache 01/14/2010   . Arthritis 01/13/2010   . Anxiety 01/13/2010     Lexapro     . Irritable bowel syndrome 01/13/2010          Over the past month, the intensity of her pain has worsened slightly.    Average pain level: 3-4 out of 10.     Level of physical activity and mobility: Good .    Has felt that she needed additional pain medication: every 2 days.    Since her last office visit, she has gone to the emergency room for pain 0 times.    Quality of her sleep in the past month: Poor due to chronic insomnia.    Mood in the past month: Good .    Side effects of her medications for pain in the past month have been none.    Evidence of drug hoarding, diversion, or misuse: no  Evidence of addictive behavior: no     Her interval medical history is otherwise unchanged.      REVIEW OF SYSTEMS  All systems reviewed and negative except:  Headaches  Diarrhea and constipation, bloating  Urinary leakage, difficulty voiding, and incomplete emptying  Stress/anxiety      CURRENT MEDICATIONS   Current Outpatient Prescriptions on File Prior to Visit   Medication Sig Dispense Refill   . diazepam (VALIUM) 10 MG tablet Take 1 tablet  every 12 hours  MDD:2  Do not fill until 06/15/12  60 tablet  0   . HYDROcodone-acetaminophen (NORCO) 5-325 MG per tablet Take 1-2 tablets by mouth every 6 hours as needed for pain. MDD 6 tablets. Do not fill until 04/10/12.  180 tablet  0   . lidocaine (XYLOCAINE) 5 % ointment Apply sparingly to painful area of abdomen and back 2 times per day.  37.5 g  5   . diclofenac (VOLTAREN) 1 % gel Apply topically 4 times daily   Apply 4 g to affected area 4 times daily.        . fluticasone (FLONASE) 50 MCG/ACT nasal spray 1 spray by Nasal route daily         . escitalopram (LEXAPRO) 5 MG tablet Take 5 mg by mouth daily (with dinner)           . loratadine (CLARITIN) 10 MG tablet Take 10 mg by mouth daily           . SUMAtriptan (IMITREX) 25 MG tablet Take 25 mg by mouth as needed  Take at onset of headache. May repeat once in 2 hours.          No current facility-administered medications on file prior to visit.          ALLERGIES  Allergies   Allergen Reactions   . Latex Dermatitis   . Kiwi Extract Shortness Of Breath   . Cephalosporins    . Levaquin Hives         Past medical, surgical, and social history were reviewed and updated in the medical record.      PHYSICAL EXAM   BP 108/70  Ht 1.651 m (5\' 5" )  Wt 53.071 kg (117 lb)  BMI 19.47 kg/m2  LMP 06/16/2012   Pain    06/26/12 1407   PainSc:   4   PainLoc: Pelvic       General: Well-appearing, well-groomed female in no apparent distress.      Psych: She does not appear sedated.  Her interactions are appropriate.  Her affect is normal.  Her mood is normal.     Abdomen and back: trigger points as noted below        Pelvic: not indicated      ASSESSMENT/PLAN    42 y.o. female with chronic abdominopelvic pain with the following diagnoses:  Patient Active Problem List   Diagnosis Code   . Arthritis 716.90   . Anxiety 300.00   . Irritable bowel syndrome 564.1   . Severe Menstrual Pain (Dysmenorrhea) 625.3   . Migraine Headache 346.90   . Myofascial pain syndrome 729.1   .  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 Chronic pelvic pain in female 625.9        TPI done.  Try Provera for menstrual suppresion.     Next visit in 6 weeks for TPI.     This note was done with the use of a template.

## 2012-07-27 ENCOUNTER — Telehealth: Payer: Self-pay

## 2012-07-27 NOTE — Telephone Encounter (Signed)
Pt called stating that she went to [facility] ED today via ambulance because she fainted at homes x 2. State she had a pain flare and has a hx of fainting at times due to the pain. States she did not get hurt. States the hospital was going to order an U/S but since the pain decreased and she was feeling better they discharged her. States her pain is currently a 3/10 and controlled with use of Vicodin and Ibuprofen prn. Pt also stating that she took the Provera for 10 days and now off. Writer questioned pt why she was off such and pt replied she read the pt information and it stated how it is usually prescribed. Writer informed pt that she is to take daily as it is for suppression of menses. Pt states she wondered and thought it was an error on the presc bottle since it had different directions in the pt information pamphlet. Revd with pt that if she is ever unsure of presc directions, then she should call. Pt agrees. States she was also started on nortriptyline and wondering if Dr. Sharlet Salina feels that is fine to take with her other meds? States the nortriptyline is on hold for a few days per provider, until she is feeling better as her BP 90/52 when she went into the ED. Reassurance given and will forward call to provider. Pt spotting currently and has had some cramping. Will restart the provera. Pt has appt next month.

## 2012-07-28 NOTE — Telephone Encounter (Signed)
Agree with daily use of Provera.  It is safe to take nortriptyline with with her other medications.  I don't know that it is contributing to her BPs - on review, the BP does run low at times.

## 2012-07-31 NOTE — Telephone Encounter (Signed)
Pt aware and agrees. Will call if needed.

## 2012-08-09 ENCOUNTER — Ambulatory Visit: Payer: Self-pay | Admitting: Obstetrics and Gynecology

## 2012-08-28 ENCOUNTER — Ambulatory Visit: Payer: Self-pay | Admitting: Obstetrics and Gynecology

## 2012-08-28 ENCOUNTER — Encounter: Payer: Self-pay | Admitting: Obstetrics and Gynecology

## 2012-08-28 VITALS — BP 112/70 | Ht 65.0 in | Wt 118.0 lb

## 2012-08-28 DIAGNOSIS — G8929 Other chronic pain: Secondary | ICD-10-CM

## 2012-08-28 DIAGNOSIS — M7918 Myalgia, other site: Secondary | ICD-10-CM

## 2012-08-28 DIAGNOSIS — N946 Dysmenorrhea, unspecified: Secondary | ICD-10-CM

## 2012-08-28 DIAGNOSIS — R102 Pelvic and perineal pain: Secondary | ICD-10-CM

## 2012-08-28 DIAGNOSIS — F1190 Opioid use, unspecified, uncomplicated: Secondary | ICD-10-CM

## 2012-08-28 LAB — DRUG SCREEN CHEMICAL DEPENDENCY, URINE
Amphetamine,UR: NEGATIVE
Benzodiazepinen,UR: POSITIVE
Cocaine/Metab,UR: NEGATIVE
Opiates,UR: NEGATIVE
THC Metabolite,UR: NEGATIVE

## 2012-08-28 MED ORDER — DIAZEPAM 10 MG PO TABS *I*
ORAL_TABLET | ORAL | Status: DC
Start: 2012-08-28 — End: 2012-10-31

## 2012-08-28 MED ORDER — HYDROCODONE-ACETAMINOPHEN 5-325 MG PO TABS *I*
ORAL_TABLET | ORAL | Status: DC
Start: 2012-08-28 — End: 2012-11-21

## 2012-08-28 NOTE — Procedures (Signed)
Trigger Point Injections    Reason for injection: Myofascial pain with trigger points    Area to be injected:    Abdomen: no   Back: yes   Pelvic floor: no    Location of trigger points:          Number of trigger points injected: 8    Number of muscles involved: 4    Anesthetic Agent: Bupivacaine 0.25%    Total Volume of Local Anesthetic: 8 ml    Pre-Injection Pain Level:  Pain    08/28/12 1433   PainSc:   4   PainLoc: Pelvic       Post-Injection Pain Level: 0-1    Adverse Reaction: no    Dainel Arcidiacono, MD

## 2012-08-28 NOTE — Addendum Note (Signed)
Addended by: Sena Hitch on: 08/28/2012 04:17 PM     Modules accepted: Orders

## 2012-08-28 NOTE — Progress Notes (Signed)
CHIEF COMPLAINT   Follow up abdominopelvic and back pain      PRESENT ILLNESS  Desiree Cunningham is a 42 y.o. female who is in today for continued management of chronic abdominopelvic and back pain.      Her current diagnoses are:    Patient Active Problem List    Diagnosis Date Noted   . Chronic pelvic pain in female 03/08/2011     Pain med agreement signed 10/2008  Norco  Valium     . Myofascial pain syndrome 08/21/2010     Bilateral multifidus muscles, left greater than right  TPI q 6 wks  Lidocaine ointment     . 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 Severe Menstrual Pain (Dysmenorrhea) 01/14/2010   . Migraine Headache 01/14/2010   . Arthritis 01/13/2010   . Anxiety 01/13/2010     Lexapro     . Irritable bowel syndrome 01/13/2010          Over the past month, the intensity of her pain is unchanged.    Average pain level: 4-5 out of 10.     Level of physical activity and mobility: Fair .    Has felt that she needed additional pain medication: daily.    Since her last office visit, she has gone to the emergency room for pain 1 time - she stopped her Provera (and other medications) due to side effects and had severe pain with menses, causing her to pass out.    Quality of her sleep in the past month: Good .    Mood in the past month: Good .    Side effects of her medications for pain in the past month have been itching if hydrocodone dose is too high.    Evidence of drug hoarding, diversion, or misuse: no  Evidence of addictive behavior: no     Her interval medical history is otherwise unchanged.      REVIEW OF SYSTEMS  All systems reviewed and negative except:  Fatigue  Headaches  Diarrhea, bloating  Urinary leakage, difficulty voiding  Joint and muscle pain      CURRENT MEDICATIONS   Current Outpatient Prescriptions on File Prior to Visit   Medication Sig Dispense Refill   . lidocaine (XYLOCAINE) 5 % ointment Apply sparingly to painful area of abdomen and back 2 times per day.  37.5 g  5   . diclofenac (VOLTAREN) 1 % gel Apply  topically 4 times daily   Apply 4 g to affected area 4 times daily.        . fluticasone (FLONASE) 50 MCG/ACT nasal spray 1 spray by Nasal route daily         . escitalopram (LEXAPRO) 5 MG tablet Take 5 mg by mouth daily (with dinner)           . loratadine (CLARITIN) 10 MG tablet Take 10 mg by mouth daily           . SUMAtriptan (IMITREX) 25 MG tablet Take 25 mg by mouth as needed   Take at onset of headache. May repeat once in 2 hours.        . medroxyPROGESTERone (PROVERA) 5 MG tablet Take 1 tablet (5 mg total) by mouth daily  30 tablet  5     No current facility-administered medications on file prior to visit.          ALLERGIES  Allergies   Allergen Reactions   . Latex Dermatitis   . Kiwi Extract Shortness Of Breath   .  Cephalosporins    . Levaquin Hives         Past medical, surgical, and social history were reviewed and updated in the medical record.      PHYSICAL EXAM   BP 112/70  Ht 1.651 m (5\' 5" )  Wt 53.524 kg (118 lb)  BMI 19.64 kg/m2   Pain    08/28/12 1433   PainSc:   4   PainLoc: Pelvic       General: Well-appearing, well-groomed female in no apparent distress.      Psych: She does not appear sedated.  Her interactions are appropriate.  Her affect is normal.  Her mood is normal.     Abdomen: not indicated    Pelvic: not indicated    Back: Trigger points as noted below           ASSESSMENT/PLAN    42 y.o. female with chronic abdominopelvic pain with the following diagnoses:  Patient Active Problem List   Diagnosis Code   . Arthritis 716.90   . Anxiety 300.00   . Irritable bowel syndrome 564.1   . Severe Menstrual Pain (Dysmenorrhea) 625.3   . Migraine Headache 346.90   . Myofascial pain syndrome 729.1   . 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 Chronic pelvic pain in female 625.9        1. TPI done.  2. Norco and Valium refilled.  3. Discussed continued Provera use.  If unable to tolerate, discussed possibility of Mirena.      Next visit in 6 weeks.     This note was done with the use of a template.

## 2012-08-30 LAB — BENZODIAZEPINE, CONFIRMATION, URINE: Confirm BZD: POSITIVE

## 2012-09-19 ENCOUNTER — Ambulatory Visit: Payer: Self-pay | Admitting: Obstetrics and Gynecology

## 2012-09-22 ENCOUNTER — Telehealth: Payer: Self-pay

## 2012-09-22 NOTE — Telephone Encounter (Signed)
Message from pt yesterday stating that she had an MRI and it is now recommended that she have a CT with contrast dye.  She is looking for a suggestion from Dr. Sharlet Salina as to where she should go for this.  Tc to pt, LMTCB as need more details.

## 2012-09-22 NOTE — Telephone Encounter (Signed)
Pt returned the call.  States that she went to an ortho specialist to see if her pain might have an ortho component.  She had a open MRI at the [facility].  There are bright white spots in the liver and spleen, reproduced in both MRI thoracic and lumbar.  These look like large lesions or artifact.  They recommend a CT with contrast dye.  Pt has an appt with her PCP on Monday.  Pt lives in [place] and would appreciate a recommendation of a provider in [place] by Dr. Sharlet Salina.  Provider contacted a dept of medicine colleague for a provider name and will call pt back with this information.

## 2012-09-25 ENCOUNTER — Ambulatory Visit: Payer: Self-pay | Admitting: Obstetrics and Gynecology

## 2012-10-12 ENCOUNTER — Telehealth: Payer: Self-pay | Admitting: School

## 2012-10-12 NOTE — Telephone Encounter (Signed)
Pt calling to see if Dr. Sharlet Salina received a copy of her CT scan.  Informed received.

## 2012-10-16 NOTE — Telephone Encounter (Signed)
Tc to pt, explained that Dr. Sharlet Salina did not have a recommendation.  Pt states she has seen a GI provider and has had the CT scan done.  She has a hemangioma and fibroids.  She has follow up appt scheduled with that provider and also with Dr. Sharlet Salina.

## 2012-10-24 ENCOUNTER — Encounter: Payer: Self-pay | Admitting: Obstetrics and Gynecology

## 2012-10-24 ENCOUNTER — Ambulatory Visit: Payer: Self-pay | Admitting: Obstetrics and Gynecology

## 2012-10-24 VITALS — BP 98/58 | Ht 65.0 in | Wt 120.6 lb

## 2012-10-24 DIAGNOSIS — M7918 Myalgia, other site: Secondary | ICD-10-CM

## 2012-10-24 DIAGNOSIS — R102 Pelvic and perineal pain: Secondary | ICD-10-CM

## 2012-10-24 DIAGNOSIS — G8929 Other chronic pain: Secondary | ICD-10-CM

## 2012-10-24 NOTE — Procedures (Signed)
Trigger Point Injections    Reason for injection: Myofascial pain with trigger points    Area to be injected:    Abdomen: no   Back: yes   Pelvic floor: no    Location of trigger points:          Number of trigger points injected: 6    Number of muscles involved: 4    Anesthetic Agent: Bupivacaine 0.25%    Total Volume of Local Anesthetic: 7 ml    Pre-Injection Pain Level:  Pain    10/24/12 1351   PainSc:   5       Post-Injection Pain Level: 0    Adverse Reaction: no    Duval Macleod, MD

## 2012-10-24 NOTE — Progress Notes (Signed)
CHIEF COMPLAINT   Follow up abdominopelvic pain      PRESENT ILLNESS  Desiree Cunningham is a 42 y.o. female who is in today for continued management of chronic abdominopelvic pain, back pain, and dysmenorrhea. She was recently diagnoses with a livery hemangioma and multiple cysts of her liver and slpeen.  All testing has indicated benign disease and no functional abnormalities.      She stopped norethindrone due to increasing hot flashes.  She is unwilling to take progestins due to the risk with the hemangioma.  She is again having severe dysmenorrhea. She is requesting definitive surgical management.        Her current diagnoses are:    Patient Active Problem List    Diagnosis Date Noted   . Chronic pelvic pain in female 03/08/2011     Pain med agreement signed 10/2008  Norco  Valium     . Myofascial pain syndrome 08/21/2010     Bilateral multifidus muscles, left greater than right  TPI q 6 wks  Lidocaine ointment     . 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 Severe Menstrual Pain (Dysmenorrhea) 01/14/2010   . Migraine Headache 01/14/2010   . Arthritis 01/13/2010   . Anxiety 01/13/2010     Lexapro     . Irritable bowel syndrome 01/13/2010          Over the past month, the intensity of her pain has worsened slightly.    Average pain level: 5 out of 10.     Level of physical activity and mobility: Poor.    Has felt that she needed additional pain medication: daily.    Since her last office visit, she has gone to the emergency room for pain 0 times.    Quality of her sleep in the past month: Fair .    Mood in the past month: Fair .    Side effects of her medications for pain in the past month have been none.    Evidence of drug hoarding, diversion, or misuse: no  Evidence of addictive behavior: no     Her interval medical history is otherwise unchanged.      REVIEW OF SYSTEMS  All systems reviewed and negative except:  Weight gain, night sweats, fatigue  Headaches  Diarrhea and constipation, bloating  Urinary leakage  Nocturia  Stress and  anxiety  Back pain  Decreased libido      CURRENT MEDICATIONS   Current Outpatient Prescriptions on File Prior to Visit   Medication Sig Dispense Refill   . Melatonin 3 MG CAPS Take by mouth       . ibuprofen (ADVIL) 200 MG tablet Take 200 mg by mouth 2 times daily as needed for Pain       . diazepam (VALIUM) 10 MG tablet Take 1 tablet every 12 hours  MDD:2  60 tablet  0   . HYDROcodone-acetaminophen (NORCO) 5-325 MG per tablet Take 1-2 tablets by mouth every 6 hours as needed for pain. MDD 6 tablets.  180 tablet  0   . lidocaine (XYLOCAINE) 5 % ointment Apply sparingly to painful area of abdomen and back 2 times per day.  37.5 g  5   . diclofenac (VOLTAREN) 1 % gel Apply topically 4 times daily   Apply 4 g to affected area 4 times daily.        . fluticasone (FLONASE) 50 MCG/ACT nasal spray 1 spray by Nasal route daily         . escitalopram (LEXAPRO)  5 MG tablet Take 5 mg by mouth daily (with dinner)           . loratadine (CLARITIN) 10 MG tablet Take 10 mg by mouth daily           . SUMAtriptan (IMITREX) 25 MG tablet Take 25 mg by mouth as needed   Take at onset of headache. May repeat once in 2 hours.          No current facility-administered medications on file prior to visit.          ALLERGIES  Allergies   Allergen Reactions   . Latex Dermatitis   . Kiwi Extract Shortness Of Breath   . Cephalosporins    . Levaquin Hives         Past medical, surgical, and social history were reviewed and updated in the medical record.      PHYSICAL EXAM   BP 98/58  Ht 1.651 m (5\' 5" )  Wt 54.704 kg (120 lb 9.6 oz)  BMI 20.07 kg/m2  LMP 10/19/2012   Pain    10/24/12 1351   PainSc:   5       General: Well-appearing, well-groomed female in no apparent distress.      Psych: She does not appear sedated.  Her interactions are appropriate.  Her affect is normal.  Her mood is normal.     Back: Trigger points as below.        Pelvic: not indicated      ASSESSMENT/PLAN    42 y.o. female with chronic abdominopelvic pain with the  following diagnoses:  Patient Active Problem List   Diagnosis Code   . Arthritis 716.90   . Anxiety 300.00   . Irritable bowel syndrome 564.1   . Severe Menstrual Pain (Dysmenorrhea) 625.3   . Migraine Headache 346.90   . Myofascial pain syndrome 729.1   . 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 Chronic pelvic pain in female 625.9        1. Desires to proceed with hysterectomy.  TLH scheduled for 11/20/12.    2. Needs Pap and [place] prior to hysterectomy.    3. Consent reviewed and signed (see pre-op H&P note).    Next visit in 4 weeks after surgery.     This note was done with the use of a template.

## 2012-10-30 ENCOUNTER — Telehealth: Payer: Self-pay | Admitting: School

## 2012-10-30 NOTE — Telephone Encounter (Signed)
Pt scheduled for annual 10/21.  Last annual 02/07/12.  LMTCB  Pt not due for annual.

## 2012-10-31 ENCOUNTER — Ambulatory Visit: Payer: Self-pay

## 2012-10-31 ENCOUNTER — Ambulatory Visit: Payer: Self-pay | Admitting: Obstetrics and Gynecology

## 2012-10-31 ENCOUNTER — Encounter: Payer: Self-pay | Admitting: Obstetrics and Gynecology

## 2012-10-31 ENCOUNTER — Other Ambulatory Visit: Payer: Self-pay

## 2012-10-31 VITALS — BP 102/74 | Ht 65.0 in | Wt 117.8 lb

## 2012-10-31 DIAGNOSIS — Z124 Encounter for screening for malignant neoplasm of cervix: Secondary | ICD-10-CM

## 2012-10-31 DIAGNOSIS — R102 Pelvic and perineal pain: Secondary | ICD-10-CM

## 2012-10-31 DIAGNOSIS — M7918 Myalgia, other site: Secondary | ICD-10-CM

## 2012-10-31 DIAGNOSIS — G8929 Other chronic pain: Secondary | ICD-10-CM

## 2012-10-31 MED ORDER — DIAZEPAM 10 MG PO TABS *I*
ORAL_TABLET | ORAL | Status: DC
Start: 2012-10-31 — End: 2012-12-29

## 2012-10-31 NOTE — Progress Notes (Signed)
Desiree Cunningham is a 42 y.o. female here for cervical cancer screening in preparation for hysterectomy 11/20/12.      HPI:  Last pap 3 years ago in [place].  No history of abnormal paps.    Has questions about post op restrictions like climbing stairs, driving.   Discussed - no restrictions on stairs - just take it easy and listen to your body.  No driving for a least one week post op and until she can be a safe driver.      Gynecologic History:  Patient's last menstrual period was 10/19/2012.       OB History    Grav Para Term Preterm Abortions TAB SAB Ect Mult Living    0 0 0 0 0 0 0 0 0 0        Obstetric Comments    GYN Hx:  LMP 01/31/2012.   Menarche: 42yo, Menses:  regular    Last pap smear: ?2011, Results: wnl, HPV:  Unclear if done    The patient is sexually active, +monog.     Female partner          Patient Active Problem List   Diagnosis Code   . Arthritis 716.90   . Anxiety 300.00   . Irritable bowel syndrome 564.1   . Severe Menstrual Pain (Dysmenorrhea) 625.3   . Migraine Headache 346.90   . Myofascial pain syndrome 729.1   . 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 Chronic pelvic pain in female 625.9       Allergies   Allergen Reactions   . Latex Dermatitis   . Kiwi Extract Shortness Of Breath   . Cephalosporins    . Levaquin Hives       Current Outpatient Prescriptions   Medication   . diazepam (VALIUM) 10 MG tablet   . Melatonin 3 MG CAPS   . ibuprofen (ADVIL) 200 MG tablet   . HYDROcodone-acetaminophen (NORCO) 5-325 MG per tablet   . lidocaine (XYLOCAINE) 5 % ointment   . diclofenac (VOLTAREN) 1 % gel   . fluticasone (FLONASE) 50 MCG/ACT nasal spray   . escitalopram (LEXAPRO) 5 MG tablet   . loratadine (CLARITIN) 10 MG tablet   . SUMAtriptan (IMITREX) 25 MG tablet     No current facility-administered medications for this visit.       ROS:  Pertinent positives noted in HPI    Objective:  BP 102/74  Ht 1.651 m (5\' 5" )  Wt 53.434 kg (117 lb 12.8 oz)  BMI 19.6 kg/m2  LMP 10/19/2012     Objective:   GENERAL:   42 y.o.   year old female in NAD.    MENTAL STATUS: Alert, normal MS. Answers all questions appropriately.  ABDOMEN: Soft, non-tender, no HSM, no palpable masses, scars, or lesions.  PELVIC EXAM:  EXT. GENITALIA: Urethral meatus and vulva normal in appearance  INTERNAL GENITALIA: Negative BSU, pink, rugatted, discharge appears physiologic  CERVIX: Midline, pink. os closed, no CMT, no lesions  VAGINA: Smooth pink walls, no lesions.    Assessment & Plan:  42 yo G0P0  For cervical cancer screening today  For hysterectomy on 11/20/12  RV for surgery and post op

## 2012-11-07 LAB — HPV DNA PROBE WITH CYTOLOGY: HPV Hybrid Capture: NEGATIVE

## 2012-11-07 LAB — GYN CYTOLOGY
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
## 2012-11-12 ENCOUNTER — Telehealth: Payer: Self-pay

## 2012-11-12 DIAGNOSIS — Z01818 Encounter for other preprocedural examination: Secondary | ICD-10-CM

## 2012-11-12 DIAGNOSIS — N946 Dysmenorrhea, unspecified: Secondary | ICD-10-CM

## 2012-11-12 NOTE — Telephone Encounter (Addendum)
Message copied by Elvis Coil on Sun Nov 12, 2012 11:05 AM    ------     Message from: Parnell, Jaquez Farrington R     Created: Fri Nov 10, 2012  2:04 PM         OBGYN SURGICAL ADMISSION FORM               Admission type:   23 hours       Date of surgery/procedure:  11/20/12       Surgery location:    [facility]              DOB:  [birth year]  AGE:  42 y.o.        Phone:  [phone] (home) [phone] (work)        Address:  [address]       [place] [address]               OBGYN Attending physician:  Patrice Paradise Resident physician:        Second surgeon:               Admitting diagnosis [ICD-9] :  dysmenorrhea - 625.3        Additional medical condition: n/a       Procedure [CPT-4]:  TLH - 91478       Special equipment: n/a              Insurance company:   [employer] Policy #:   [id number] Percert #:        Secondary insurance company: [employer]    Policy #:   [id number]  Precert #:               Allergies: Latex; Kiwi extract; Cephalosporins; and Levaquin       Precautions: Latex       Medical justification for admission 1 day prior to surgery/procedure: N/A               Types of anesthesia: General       Estimated length of case: 180              Preadmission testing:       CBC       T&S       UA with #reflex# to culture              Office preop with MD:   done 10/24/12       Preop testing to be done at:  Anytime before 11/07 @ any [facility] lab       Clearance appointments/Consults: none       Office postop with MD: [facility] 4 weeks post-op    11/12/2012   letter sent, order entry in [employer]Record [place] L Aubrielle Stroud     11/12/2012  Both [facility] & DSS Hysterectomy consents are required.  Neither can be found scanned into media.  Email sent to [facility] staff.  Pt has no history of BTL - therefore she must sign the DSS Hysterectomy consent.  If consents are not located and due to the fact that Dr Sharlet Salina is out of the office 11/03 to 11/07 consents must be signed DOS in the surgery center.    11/12/12 Per email from [facility] - consents were presented to  [facility] staff 11/10/12 to be scanned.  Koleen Nimrod L Arlett Goold    11/13/12 Per [facility] staff - consents have been scanned .Koleen Nimrod L Meliah Appleman     11/16/2012  [facility]  Pending Berkley Harvey [phone].  Clinical faxed to [phone].  Spoke w/ Wynona Canes from [facility] plan.  She has the clinical and will review and advise.  Koleen Nimrod L Donyel Castagnola     11/16/2012  Per Christine @ [address] - Auth approved - [id number] from 11/16/12 to 11/16/13.  Koleen Nimrod Elenor Quinones

## 2012-11-16 ENCOUNTER — Ambulatory Visit
Admit: 2012-11-16 | Discharge: 2012-11-16 | Disposition: A | Discharge status: Home / Self Care | Payer: Self-pay | Source: Ambulatory Visit | Attending: Obstetrics and Gynecology | Admitting: Obstetrics and Gynecology

## 2012-11-16 DIAGNOSIS — N946 Dysmenorrhea, unspecified: Secondary | ICD-10-CM

## 2012-11-16 DIAGNOSIS — Z01818 Encounter for other preprocedural examination: Secondary | ICD-10-CM

## 2012-11-16 LAB — URINALYSIS WITH REFLEX TO MICROSCOPIC
Blood,UA: NEGATIVE
Ketones, UA: NEGATIVE
Leuk Esterase,UA: NEGATIVE
Nitrite,UA: NEGATIVE
Protein,UA: NEGATIVE mg/dL
Specific Gravity,UA: 1.009 (ref 1.002–1.030)
pH,UA: 6 (ref 5.0–8.0)

## 2012-11-16 LAB — CBC
Hematocrit: 34 % (ref 34–45)
Hemoglobin: 11.3 g/dL (ref 11.2–15.7)
MCV: 87 fL (ref 79–95)
Platelets: 249 10*3/uL (ref 160–370)
RBC: 3.9 MIL/uL (ref 3.9–5.2)
RDW: 14.9 % — ABNORMAL HIGH (ref 11.7–14.4)
WBC: 6.4 10*3/uL (ref 4.0–10.0)

## 2012-11-16 LAB — TYPE AND SCREEN
ABO RH Blood Type: O POS
Antibody Screen: NEGATIVE
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
## 2012-11-20 ENCOUNTER — Encounter: Payer: Self-pay | Admitting: Obstetrics and Gynecology

## 2012-11-20 ENCOUNTER — Observation Stay
Admit: 2012-11-20 | Disposition: A | Discharge status: Home / Self Care | Payer: Self-pay | Source: Ambulatory Visit | Attending: Obstetrics and Gynecology | Admitting: Obstetrics and Gynecology

## 2012-11-20 LAB — POCT URINE PREGNANCY: Lot #: 700547

## 2012-11-20 MED ORDER — HEPARIN SODIUM 5000 UNIT/ML SQ *I*
5000.0000 [IU] | Freq: Three times a day (TID) | SUBCUTANEOUS | Status: DC
Start: 2012-11-20 — End: 2012-11-21
  Administered 2012-11-20 – 2012-11-21 (×2): 5000 [IU] via SUBCUTANEOUS
  Filled 2012-11-20 (×2): qty 1

## 2012-11-20 MED ORDER — SODIUM CHLORIDE 0.9 % IV SOLN WRAPPED *I*
20.0000 mL/h | Status: DC
Start: 2012-11-20 — End: 2012-11-20

## 2012-11-20 MED ORDER — DIAZEPAM 5 MG PO TABS *I*
ORAL_TABLET | ORAL | Status: AC
Start: 2012-11-20 — End: 2012-11-20
  Filled 2012-11-20: qty 2

## 2012-11-20 MED ORDER — MEPERIDINE HCL 25 MG/ML IJ SOLN *I*
12.5000 mg | INTRAMUSCULAR | Status: DC | PRN
Start: 2012-11-20 — End: 2012-11-20

## 2012-11-20 MED ORDER — CLINDAMYCIN PHOSPHATE IN D5W 900 MG/50ML IV SOLN *I*
900.0000 mg | INTRAVENOUS | Status: AC
Start: 2012-11-20 — End: 2012-11-20
  Administered 2012-11-20: 900 mg via INTRAVENOUS
  Filled 2012-11-20: qty 50

## 2012-11-20 MED ORDER — LIDOCAINE HCL 1 % IJ SOLN
0.1000 mL | INTRAMUSCULAR | Status: DC | PRN
Start: 2012-11-20 — End: 2012-11-20

## 2012-11-20 MED ORDER — NALOXONE HCL 0.4 MG/ML IJ SOLN *WRAPPED*
0.1000 mg | Status: DC | PRN
Start: 2012-11-20 — End: 2012-11-21

## 2012-11-20 MED ORDER — DOCUSATE SODIUM 100 MG PO CAPS *I*
100.0000 mg | ORAL_CAPSULE | Freq: Two times a day (BID) | ORAL | Status: DC
Start: 2012-11-20 — End: 2012-11-21
  Administered 2012-11-20 – 2012-11-21 (×2): 100 mg via ORAL
  Filled 2012-11-20 (×2): qty 1

## 2012-11-20 MED ORDER — OXYCODONE-ACETAMINOPHEN 5-325 MG PO TABS *I*
1.0000 | ORAL_TABLET | ORAL | Status: DC | PRN
Start: 2012-11-20 — End: 2012-11-21

## 2012-11-20 MED ORDER — SUMATRIPTAN SUCCINATE 50 MG PO TABS *I*
50.0000 mg | ORAL_TABLET | Freq: Every day | ORAL | Status: DC | PRN
Start: 2012-11-21 — End: 2012-11-21
  Filled 2012-11-20 (×2): qty 1

## 2012-11-20 MED ORDER — SODIUM CHLORIDE 0.9 % IV SOLN WRAPPED *I*
5.0000 mg/kg | INTRAMUSCULAR | Status: DC
Start: 2012-11-20 — End: 2012-11-20
  Administered 2012-11-20: 273.6 mg via INTRAVENOUS
  Filled 2012-11-20 (×2): qty 6.84

## 2012-11-20 MED ORDER — HEPARIN SODIUM 5000 UNIT/ML SQ *I*
SUBCUTANEOUS | Status: AC
Start: 2012-11-20 — End: 2012-11-20
  Administered 2012-11-20: 5000 [IU] via SUBCUTANEOUS
  Filled 2012-11-20: qty 1

## 2012-11-20 MED ORDER — LACTATED RINGERS IV SOLN *I*
150.0000 mL/h | INTRAVENOUS | Status: DC
Start: 2012-11-20 — End: 2012-11-21
  Administered 2012-11-20: 150 mL/h via INTRAVENOUS

## 2012-11-20 MED ORDER — IBUPROFEN 600 MG PO TABS *I*
600.0000 mg | ORAL_TABLET | Freq: Four times a day (QID) | ORAL | Status: DC | PRN
Start: 2012-11-21 — End: 2012-11-21

## 2012-11-20 MED ORDER — HYDROMORPHONE HCL 2 MG/ML IJ SOLN *WRAPPED*
0.4000 mg | INTRAMUSCULAR | Status: DC | PRN
Start: 2012-11-20 — End: 2012-11-20
  Administered 2012-11-20 (×2): 0.4 mg via INTRAVENOUS

## 2012-11-20 MED ORDER — OXYCODONE-ACETAMINOPHEN 5-325 MG PO TABS *I*
2.0000 | ORAL_TABLET | ORAL | Status: DC | PRN
Start: 2012-11-20 — End: 2012-11-21
  Administered 2012-11-20: 1 via ORAL
  Administered 2012-11-20 – 2012-11-21 (×4): 2 via ORAL
  Filled 2012-11-20 (×5): qty 2

## 2012-11-20 MED ORDER — MENTHOL THROAT LOZENGE *I*
1.0000 | LOZENGE | OROMUCOSAL | Status: DC | PRN
Start: 2012-11-20 — End: 2012-11-21
  Administered 2012-11-20: 1 via BUCCAL
  Filled 2012-11-20 (×9): qty 1

## 2012-11-20 MED ORDER — SUMATRIPTAN SUCCINATE 25 MG PO TABS *I*
25.0000 mg | ORAL_TABLET | Freq: Once | ORAL | Status: AC
Start: 2012-11-20 — End: 2012-11-20
  Administered 2012-11-20: 25 mg via ORAL
  Filled 2012-11-20: qty 1

## 2012-11-20 MED ORDER — GABAPENTIN 600 MG PO TABS
600.0000 mg | ORAL_TABLET | Freq: Three times a day (TID) | ORAL | Status: DC
Start: 2012-11-20 — End: 2012-11-21
  Filled 2012-11-20 (×6): qty 1

## 2012-11-20 MED ORDER — CLINDAMYCIN PHOSPHATE IN D5W 900 MG/50ML IV SOLN *I*
INTRAVENOUS | Status: DC
Start: 2012-11-20 — End: 2012-11-20
  Filled 2012-11-20: qty 50

## 2012-11-20 MED ORDER — PROMETHAZINE HCL 25 MG/ML IJ SOLN *I*
6.2500 mg | Freq: Once | INTRAMUSCULAR | Status: DC | PRN
Start: 2012-11-20 — End: 2012-11-20

## 2012-11-20 MED ORDER — LACTATED RINGERS IV SOLN *I*
20.0000 mL/h | INTRAVENOUS | Status: DC
Start: 2012-11-20 — End: 2012-11-20
  Administered 2012-11-20: 20 mL/h via INTRAVENOUS

## 2012-11-20 MED ORDER — SUMATRIPTAN SUCCINATE 25 MG PO TABS *I*
25.0000 mg | ORAL_TABLET | Freq: Every day | ORAL | Status: DC | PRN
Start: 2012-11-20 — End: 2012-11-20
  Administered 2012-11-20: 25 mg via ORAL
  Filled 2012-11-20: qty 1

## 2012-11-20 MED ORDER — ESCITALOPRAM OXALATE 10 MG PO TABS *I*
5.0000 mg | ORAL_TABLET | Freq: Every evening | ORAL | Status: DC
Start: 2012-11-20 — End: 2012-11-21
  Filled 2012-11-20 (×2): qty 1

## 2012-11-20 MED ORDER — HYDROMORPHONE HCL 2 MG/ML IJ SOLN *WRAPPED*
INTRAMUSCULAR | Status: AC
Start: 2012-11-20 — End: 2012-11-20
  Filled 2012-11-20: qty 1

## 2012-11-20 MED ORDER — KETOROLAC TROMETHAMINE 30 MG/ML IJ SOLN *I*
30.0000 mg | Freq: Four times a day (QID) | INTRAMUSCULAR | Status: DC
Start: 2012-11-20 — End: 2012-11-21
  Administered 2012-11-20 – 2012-11-21 (×3): 30 mg via INTRAVENOUS
  Filled 2012-11-20 (×3): qty 1

## 2012-11-20 MED ORDER — HALOPERIDOL LACTATE 5 MG/ML IJ SOLN *I*
0.5000 mg | Freq: Once | INTRAMUSCULAR | Status: DC | PRN
Start: 2012-11-20 — End: 2012-11-20

## 2012-11-20 MED ORDER — HEPARIN SODIUM 5000 UNIT/ML SQ *I*
5000.0000 [IU] | Freq: Once | SUBCUTANEOUS | Status: AC
Start: 2012-11-20 — End: 2012-11-20

## 2012-11-20 MED ORDER — DIAZEPAM 5 MG PO TABS *I*
10.0000 mg | ORAL_TABLET | Freq: Two times a day (BID) | ORAL | Status: DC | PRN
Start: 2012-11-20 — End: 2012-11-21
  Administered 2012-11-20: 10 mg via ORAL

## 2012-11-20 MED ORDER — ALBUTEROL SULFATE (2.5 MG/3ML) 0.083% IN NEBU *I*
2.5000 mg | INHALATION_SOLUTION | Freq: Once | RESPIRATORY_TRACT | Status: DC | PRN
Start: 2012-11-20 — End: 2012-11-20

## 2012-11-20 NOTE — Progress Notes (Signed)
Pt ambulated around nurses station x2 with 1 assist. Gait steady.

## 2012-11-20 NOTE — Progress Notes (Signed)
GYN POST-OP CHECK:     S:  Patient complaining of painful headache.  Has a h/o migraines.  Takes Imitrex 25mg  to 50mg  for treatment at home.  Also complaining of sore throat s/p intubation.  Pain is well controlled with percocet. Denies chest pain, shortness of breath, nausea, vomiting.  Tolerating regular diet. Foley in place with good UOP draining yellow urine.  Flatus: present    Vitals:   Filed Vitals:    11/20/12 2032   BP: 100/61   Pulse: 91   Temp: 36.4 C (97.5 F)   Resp: 20   Height:    Weight:         I/Os:    Intake/Output Summary (Last 24 hours) at 11/20/12 2203  Last data filed at 11/20/12 1959   Gross per 24 hour   Intake   2390 ml   Output   1600 ml   Net    790 ml     I/O this shift:  11/10 1500 - 11/10 2259  In: 2390 (44.7 mL/kg) [P.O.:480; I.V.:1910]  Out: 1600 (29.9 mL/kg) [Urine:1575; Blood:25]  Net: 790  Weight used: 53.5 kg    Physical Exam:   HEENT: Normocephalic; atraumatic  Cardiovascular: Regular rate and rhythm with no murmurs  Respiratory: Clear to auscultate  Abdomen:  Soft, appropriately tender, nondistended, +BS, no guarding, no rebound, bandages in place on laparoscopic wounds.   Extremities/Skin: No edema noted, SCDs in place  Bandage: Clean, dry and intact    A/P: 42 y.o. female on POD#0 s/p TLH, BS, cysto. Doing well.  Pain well controlled.  Good UOP.  +Flatus.  Good p.o. intake.      Headache  --prescribing 50mg  Imitrex daily for migraine headache    Dysphagia  --prescribing cepastat lozenge PRN    Post Op  1) Continue routine postop care  2) Increase ambulation  3) Dispo: Likely d/c home tomorrow    Shelton Silvas MD  OBGYN R1  [phone]
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
## 2012-11-20 NOTE — INTERIM OP NOTE (Signed)
Interim Op Note    Date of Surgery: 11/20/12  Surgeon: Brayton El, MD  First Assistant: Hassan Rowan. MD  Second Assistant: Jackalyn Lombard, MD    Pre-Op Diagnosis:   1. Chronic pelvic pain  2. Dysmenorrhea    Anesthesia Type: General    Post-Op Diagnosis:  1. Chronic pelvic pain  2. Dysmenorrhea  3. Suspected endometriosis, pathology pending    Additional Findings (Including unexpected complications): Multiple lesions in right ovary, right broad ligament, posterior cul-de-sac, rectum, left uterosacral ligament concerning for endometriosis. Approx 1-2 cm left fundal fibroid, approx 1 cm right posterior fibroid. Otherwise normal appearing uterus, fallopian tubes and ovaries bilaterally.    Procedure(s) Performed (including CPT 4 Code if available)  1. TLH  2. BS  3. Excision of possible endometriotic lesions    Estimated Blood Loss: 25 cc   Packing: No  Drains: Yes, Type foley, # 1. Removed? NO, plan for removal is POD#1  Fluid Totals: Intakes: 1400 cc Outputs: 150 cc  Specimens to Pathology: yes  Patient Condition: good    Jackalyn Lombard, MD  Ob/Gyn R3  [phone]  11/20/2012  3:28 PM

## 2012-11-20 NOTE — Op Note (Addendum)
OPERATIVE NOTE     Date of Surgery: 11/20/2012  Surgeon: Brayton El, MD  First Assistant: Hassan Rowan, MD (Fellow) & Jackalyn Lombard, MD (Resident)     Pre-Op Diagnosis:   1. Chronic pelvic pain   2. Dysmenorrhea    Anesthesia Type: General     Post-Op Diagnosis:   1. Chronic pelvic pain   2. Dysmenorrhea  3. Endometriosis    Procedure(s) Performed:   1. Exam under anesthesia   2. Total laparoscopic hysterectomy and bilateral salpingectomy   3. Excision and ablation of endometriosis  4. Cystoscopy     Estimated Blood Loss: 25 cc   Fluid Totals: Intakes: 1400 cc  Output: 150 cc   Specimens to Pathology: Yes, uterus, cervix, bilateral fallopian tubes. Biopsies of right posterior broad ligament (3 total), right posterior cul de sac, rectum, left uterosacral ligament, and left posterior broad ligament.  Patient Condition: good     Indications:   Patient is a 42yo G0 female with chronic pelvic pain and dysmenorrhea. She attempted medical management with norethindrone but discontinued it due to hot flashes. She was also recently diagnosed with a liver hemangioma so does not want to continue progestins. The patient was counseled regarding expectant management, continued medical management, and surgical intervention. She requested definitive surgical intervention via total hysterectomy and bilateral salpingectomy with ovarian preservation. Risks and benefits were reviewed, and she was consented prior to the procedure.     Operative Findings:   Exam under anesthesia revealed normal external and internal genitalia with mobile anteverted uterus and normal cervix.     Laparoscopy revealed normal small uterus with 2 cm pedunculated left fundal fibroid and 1 cm subserosal right posterior fibroid. Normal left ovary, left fallopian tube, and right fallopian tube. Endometriosis lesions over the right ovary were ablated. There were white lesions suspicious for endometriosis over the right posterior broad ligament, left uterosacral  ligament, and left posterior broad ligament. There were small nodules suspicious for endometriosis over the right posterior cul de sac and rectum. Abdomen, bowel, and appendix were grossly normal in appearance.     Cystoscopy revealed bilateral ureteral jets of fluorescein stained urine. No evidence of bladder injury or suture in bladder.     Description of Procedure:   The patient was taken to the OR where her identity and procedure were confirmed. She was placed in supine position on the operating table. General anesthesia was induced, and she was intubated by the anesthesia team. She was placed in dorsal lithotomy position with her legs in [place] stirrups and SCDs on.     Perineum and vagina were prepped sterilely with chlorhexadine. The abdomen was prepped sterilely with ChloraPrep, and the patient was then draped. Surgical pause was conducted. Foley catheter was inserted sterilely. Anterior and posterior retractors were placed in the vagina and the anterior lip of the cervix was grasped with a single-tooth tenaculum. The Rumi II uterine manipulator was placed and the tenaculum and retractors were removed.     Attention was then turned to the abdomen. Local anesthesia using 0.5% bupivacaine was used prior to each skin incision. A 5 mm incision was made in the inferior aspect of the umbilicus, and a 5 mm trocar was inserted with the Visiport. Intraperitoneal placement was confirmed visually, and the abdomen was insufflated with carbon dioxide. The patient was placed in Trendelenburg position. A survey of the abdomen and pelvis was done with findings as noted above. In the left lower quadrant, a 5 mm incision was made  and a 5 mm trocar was inserted under video guidance. In the right lower quadrant, a 5 mm incision was made and a 5 mm trocar was inserted under video guidance.     The lesions on the right ovary suspicious for endometriosis were ablated using the Kleppinger bipolar instrument. All peritoneal lesions  suspicious for endometriosis as above were dissected away from the underlying fat and tissue and excised. Hemostasis was assured. Hemostasis was achieved using the Kleppinger bipolar instrument. The intraperitoneal pressure was dropped to 6 mm Hg and hemostasis was assured.    Attention was turned to the hysterectomy. The Harmonic scalpel was used as the energy source. The left ureter was identified coursing low along the pelvic side wall. The left fallopian tube was grasped and transected from the ovary and mesosalpinx, followed by the utero-ovarian ligament, round ligament, and broad ligament. The bladder flap was created by transecting the vesicouterine peritoneum. The left uterine artery was then grasped and sealed multiple times. Good hemostasis was noted. The same procedure was repeated on the right side after identification of the left ureter. The bilateral uterine arteries were then cut after adequate vessel sealing with the harmonic scalpel was performed. Bilateral bites of the cardinal ligaments were taken. The colpotomy was performed with good visualization of the [employer]. The uterus was removed through the vagina along with the uterine manipulator. Hemostasis was assured.     The specimen was placed in the vagina to maintain the pneumoperitoneum. The vaginal cuff was then reapproximated with 4 interrupted sutures of 0 vicryl utilizing extracorporeal knot tying. Good hemostasis was noted. All pedicles were examined and were noted to be hemostatic.     The foley catheter was removed and the cystoscope was inserted. Complete evaluation of the bladder mucosa was performed noting no lacerations, injury, or sutures. There were bilateral ureteral jets of fluorescein stained urine noted. The cystoscope was withdrawn and a foley catheter was re-inserted sterilely.     Trocars were removed and the abdomen was desufflated. The specimen was removed from the vagina and sent to pathology. All other instruments were  removed from the vagina. Skin was reapproximated with interrupted sutures of 4-0 vicryl. The abdomen was cleansed, dried, and sterile dressings applied over the incisions.     The abdomen and perineum were cleansed, and the patient was placed back in supine position. Anesthesia was reversed. The patient was extubated and taken to PACU in satisfactory condition. All sponge, needle, and instrument counts were correct at the end of the procedure.     Hassan Rowan, MD   [facility] Fellow    I was present throughout the entire procedure.    Sharmeka Palmisano, MD

## 2012-11-20 NOTE — Progress Notes (Signed)
Received pt from PACU via stretcher @ 1700. Alert and oriented x3. Respirations easy. Skin warm and dry. IV infusing without redness, pain or swelling @ site. 3 scope sites dry and intact.  Dermabond to incision. Foley patent and draining. scd's on. Handwashing gel and CAUTI, infection prevention and surgical site brochures at bedside. Family in to visit. Pt tolerating liquids.

## 2012-11-20 NOTE — Discharge Instructions (Signed)
Discharge Instructions    Surgery site care after surgery   You have 3 small incisions on your abdomen. You can shower as usual, patting the incisions dry.     Your incisions will take time to heal. It may be sensitive or firm to touch. They may also feel numb around the incision for an indefinite period of time. A few weeks after surgery, you may rub Vitamin E oil into the incisions to help soften it and decrease discoloration.    There is an incision inside your vagina with stitches that is healing. As the incision in your vagina heals, you may notice a pink tinged vaginal drainage. The discharge may continue for up to 8 weeks. It if possible, as the suture material dissolves, some pieces may come out through the vagina. This is normal. You may not have intercourse or use tampons or douche for 6-8 weeks after your surgery.     Diet after surgery   Because of the extensive surgery in the abdominal area, your bowels are very slow to return to their usual state of working. Eat easily digestible foods, stay away from high fiber foods (whole grains, bran, fresh vegetables) and gassy foods (beans, onions, broccoli) for 4-6 weeks. Be prepared for some constipation over the next few weeks. You may use stool softeners (Colace) or laxatives (Miralax, Milk of Magnesia, Senna) if necessary. Stool softeners are recommended twice daily if you are taking narcotic pain medications. Increasing fluids and walking will also help your bowels return to normal.     Activity: Surgery and anesthesia are a great body stress. Even 1-2 months after surgery you may feel tired. For the first week restrict your activities to home and limit trips up and down the stairs. You may be a passenger in a car, but you can not drive for 2 weeks. You can not drive you are taking narcotic pain medication. Do not lift heavy objects greater than 5 pounds. Do not exercise until your post operative visit. You may go for walks based on your comfort  level.     Medications: You may be given a prescription for the pain for the first few weeks. After that, unless you are allergic, try Advil or Motrin, or Extra-Strength Tylenol for discomfort. Do not mix alcohol with pain medication. If you are given an antibiotic, be sure to finish all the medication.     You have a post operative appointment listed on this paperwork. Please make sure you attend this appointment. If this time if not convenient for you and your family, please call to reschedule.     Call Dr. Sharlet Salina at [phone] for the following problems:  - Shortness of Breath  - Nausea/Vomiting/Diarrhea  - Incisions opening or draining  - Constipation unrelieved by medications or no bowel movement x3 days  - Shaking or chills  - Pain that your pain medication does not control  - Severe cramping or abdominal pain not relieved by pain medications   - Temperature greater than 100.5  - Lightheadedness, dizziness, or fainting.   - Sudden onset of shortness of breath or wheezing   - Inability to urinate   - Foul smelling vaginal or incisional discharge   - Heavy vaginal bleeding (soaking through 1 pad every 1-2 hours)   - Swollen or tender lower leg  - With any other concerns or questions       Also,  A) DO NOT drive or operate any machinery until completely off narcotics  B) DO NOT drink alcoholic beverages while taking narcotic pain medications.   C) DO NOT make major decisions, sign contracts, etc for 24 hours   Please continue to adhere to these precautions if you are taking narcotic medication.

## 2012-11-20 NOTE — H&P (Signed)
UPDATES TO PATIENT'S CONDITION on the DAY OF SURGERY/PROCEDURE     I. Updates to Patient's Condition (to be completed by a provider privileged to complete a H&P, following reassessment of the patient by the provider):     Day of Surgery/Procedure Update:     History   History reviewed and no change     Physical   Physical exam updated and no change     II. Procedure Readiness   I have reviewed the patient's H&P and updated condition. By completing and signing this form, I attest that this patient is ready for surgery/procedure.     III. Attestation   I have reviewed the updated information regarding the patient's condition and it is appropriate to proceed with the planned surgery/procedure.     Doree Kuehne, MD  11:40 AM

## 2012-11-20 NOTE — Anesthesia Post-procedure Eval (Signed)
Anesthesia Post-op Note    Patient: Desiree Cunningham    Procedure(s) Performed: 1. Exam under anesthesia   2. Total laparoscopic hysterectomy and bilateral salpingectomy   3. Cystoscopy     Anesthesia type: General    Patient location: PACU    Mental Status: Recovered to baseline    Patient able to participate in this evaluation: yes  Last Vitals: BP: 109/71 mmHg (11/20/12 1630)  BP MAP : 79 mmHg (11/20/12 1630)  Heart Rate: 84 (11/20/12 1630)  Temp: 36.6 C (97.9 F) (11/20/12 1515)  Resp: 19 (11/20/12 1630)  Height: 165 cm (5' 4.96") (11/20/12 1033)  Weight: 53.5 kg (117 lb 15.1 oz) (11/20/12 1033)  BMI (Calculated): 19.7 (11/20/12 1033)  BSA (Calculated - sq m): 1.57 sq meters (11/20/12 1033)  SpO2: 99 % (11/20/12 1630)      Post-op vital signs noted above are within patient's normal range  Post-op vitals signs: stable  Respiratory function: baseline    Airway patent: Yes    Cardiovascular and hydration status stable: Yes    Post-Op pain: Adequate analgesia    Post-Op nausea and vomiting: none    Post-Op assessment: no apparent anesthetic complications    Complications: none    Attending Attestation: All indicated post anesthesia care provided    Author: Clinton Gallant, MD  as of: 11/20/2012  at: 4:31 PM

## 2012-11-21 MED ORDER — DOCUSATE SODIUM 100 MG PO CAPS *I*
100.0000 mg | ORAL_CAPSULE | Freq: Two times a day (BID) | ORAL | Status: AC
Start: 2012-11-21 — End: 2012-12-21

## 2012-11-21 MED ORDER — OXYCODONE-ACETAMINOPHEN 5-325 MG PO TABS *I*
1.0000 | ORAL_TABLET | ORAL | Status: DC | PRN
Start: 2012-11-21 — End: 2012-11-21

## 2012-11-21 MED ORDER — OXYCODONE-ACETAMINOPHEN 5-325 MG PO TABS *I*
ORAL_TABLET | ORAL | Status: DC
Start: 2012-11-21 — End: 2012-12-29

## 2012-11-21 MED ORDER — INFLUENZA VAC SPLIT QUAD PF 0.5 ML IM SUSP *I*
0.5000 mL | INTRAMUSCULAR | Status: AC
Start: 2012-11-21 — End: 2012-11-21
  Administered 2012-11-21: 0.5 mL via INTRAMUSCULAR
  Filled 2012-11-21: qty 0.5

## 2012-11-21 MED ORDER — PNEUMOCOCCAL VAC POLYVALENT 25 MCG/0.5ML INJ *WRAPPED*
0.5000 mL | INJECTION | INTRAMUSCULAR | Status: AC
Start: 2012-11-21 — End: 2012-11-21
  Administered 2012-11-21: 0.5 mL via INTRAMUSCULAR
  Filled 2012-11-21: qty 0.5

## 2012-11-21 MED ORDER — IBUPROFEN 600 MG PO TABS *I*
600.0000 mg | ORAL_TABLET | Freq: Three times a day (TID) | ORAL | Status: DC | PRN
Start: 2012-11-21 — End: 2012-12-18

## 2012-11-21 MED ORDER — HYDROCODONE-ACETAMINOPHEN 5-325 MG PO TABS *I*
ORAL_TABLET | ORAL | Status: DC
Start: 2012-11-21 — End: 2013-02-16

## 2012-11-21 MED FILL — Bupivacaine HCl Preservative Free (PF) Inj 0.5%: INTRAMUSCULAR | Qty: 30 | Status: AC

## 2012-11-21 MED FILL — Midazolam HCl Inj 2 MG/2ML (Base Equivalent): INTRAMUSCULAR | Qty: 2 | Status: AC

## 2012-11-21 MED FILL — Fentanyl Citrate Inj 0.05 MG/ML: INTRAMUSCULAR | Qty: 5 | Status: AC

## 2012-11-21 NOTE — Progress Notes (Addendum)
GYN Progress Note:    S:  Patient without complaints. Tolerating diet. She is ambulating. Her pain is well controlled with percocet. She reports a sharp pain in the right 3rd intercostal space especially when taking a deep breath. Denies shortness of breath, nausea and vomiting.  Pain is well controlled. She is asking to go home and would like to have Flu and Pneumonia vaccine. Flatus: present    O:   Filed Vitals:    11/21/12 0333   BP: 111/63   Pulse: 64   Temp: 36.5 C (97.7 F)   Resp: 16   Height:    Weight:        Range:   BP: (96-125)/(56-71)   Temp:  [36 C (96.8 F)-37.6 C (99.7 F)]   Temp src:  [-]   Heart Rate:  [64-96]   Resp:  [12-23]   SpO2:  [96 %-99 %]   Height:  [165 cm (5' 4.96")]   Weight:  [53.5 kg (117 lb 15.1 oz)]      I/Os:    Intake/Output Summary (Last 24 hours) at 11/21/12 0626  Last data filed at 11/21/12 0230   Gross per 24 hour   Intake   2390 ml   Output   4725 ml   Net  -2335 ml     I/O this shift:  11/10 2300 - 11/11 0659  In: - (0 mL/kg)   Out: 1175 (22 mL/kg) [Urine:1175]  Net: -1175  Weight used: 53.5 kg    Physical Exam:  HEENT: Normocephalic; atraumatic  Cardiovascular: Regular rate and rhythm with no murmurs  Respiratory: Clear to auscultate  Abdomen:  Soft, appropriately tender, nondistended, +BS  Extremities/Skin: No edema noted and no calf tenderness  Incision: Clean, dry and intact    Labs:    Recent Labs  Lab 11/16/12  1623   WBC 6.4   Hemoglobin 11.3   Hematocrit 34   Platelets 249       A/P:  42 y.o. female on POD# 1 s/p TLH, BS, cysto on 11/20/12 for chronic pelvic pain and dysmenorrhea.  Doing well.  Post op:   - Motrin/Percocet PRN   - Gabapentin 600 mg TID   - D/c foley POD#1   - Regular diet   - DVT ppx: SCDs, heparin     Anxiety:   - Lexapro 5 mg QHS   - Valium 10 mg q12hrs PRN    Dispo:   - Discharge home today if continues to do well clinically  - Flu and Pneumococcal vaccine ordered    Desiree Cunningham  Pager # [phone]  11/21/2012  6:35 AM    GYN Attending    I saw  and evaluated the patient. I agree with the resident's/fellow's findings and plan of care as documented above.    Desiree Cunningham is a 42 y.o. POD#1 s/p TLH for chronic pelvic pain, dysmenorrhea, and endometriosis.  She is doing well.      Pain is controlled with Percocet.    Tolerating PO: Yes.  Nausea: No.  Flatus: Yes.   Foley is out.  Void: Yes.    D/C home today.    Rx for Percocet for post-op pain and Vicodin for routine pain medication.    Post-op appt in 4-6 wks.    Desiree Sharples, MD

## 2012-11-21 NOTE — Progress Notes (Signed)
Foley removed at 0235 . CAUTI info sheet provided at bedside, and all signs/symptoms of CAUTI reviewed with pt who reports understanding. Pt educated on the need to void within 6 hours of foley removal and to measure voids x 3 in collection container.     Samule Ohm, RN

## 2012-11-21 NOTE — Student Note (Signed)
Gynecology Post-Op Progress Note  (Medical Student)     Subjective  Desiree Cunningham is POD#1 s/p total laparoscopic hysterectomy, bilateral salpingectomy, cystoscopy. She is experiencing some soreness around her incision scars and having more pain when she sits or changes positions, but does not feel much pain with walking. She has been walking three times around the unit since last night and feels she is doing very well. She is eating and drinking normally. She had some nausea with a meal last night, but has been eating without nausea since then. She feels that her urination is much easier and more painless than it has been in years because she does not feel a pressure or burning when she pees. She has not yet had a bowel movement, but has passed gas. She states that passing gas is painful. She did not replace the first pad she was given and has not been using one. She has not had any noticeable bleeding.     ROS: Denies chest pain, SOB, N/V, diarrhea, constipation     Objective:   Filed Vitals:    11/21/12 0333   BP: 111/63   Pulse: 64   Temp: 36.5 C (97.7 F)   Resp: 16   Height:    Weight:      I/Os  I/O last 3 completed shifts:  11/09 2300 - 11/10 2259  In: 2390 (44.7 mL/kg) [P.O.:480; I.V.:1910 (1.5 mL/kg/hr)]  Out: 3550 (66.4 mL/kg) [Urine:3525 (2.7 mL/kg/hr); Blood:25]  Net: -1160  Weight used: 53.5 kg  I/O this shift:  11/10 2300 - 11/11 0659  In: - (0 mL/kg)   Out: 1175 (22 mL/kg) [Urine:1175]  Net: -1175  Weight used: 53.5 kg    Physical Exam:   HEENT: normocephalic, atraumatic  Heart: normal S1, S2, RRR, no murmurs  Lungs: clear to auscultation bilaterally in anterior lung fields  Abdomen: soft, tender to palpation around incision sites, incision sites are clean and healing well, active bowel sounds present, dull to percussion, slightly bloated in lower quadrants    Assessment/Plan:  Desiree Cunningham is a 42 y.o. woman s/p TLH, BS, cystoscopy on POD#1. She is doing well and her pain is being adequately  controlled.    FEN/GI  Continue on regular diet  Monitor I/Os  Colace 100 mg two times daily     Monitoring  Vital Signs Q4H    Pain  Toradol 30 mg IV Q6H  Percocet 5-325 mg per tablet 2 tablets Q4H PRN    Disposition  Plan to discharge today with outpatient follow up in one week with Dr. Brayton El.    This is a Psychologist, occupational note. Please refer to attending or resident physician's note for revised assessment and plan.   Abelina Bachelor, CC3  6:54 AM

## 2012-11-21 NOTE — Discharge Summary (Signed)
Name: Desiree Cunningham MRN: [id number] DOB: Apr 25, 1970     Admit Date: 11/20/2012   Date of Discharge: 11/21/2012    Discharge Attending Physician: Brayton El      Hospitalization Summary    CONCISE NARRATIVE: 42 y.o. s/p TLH, BS, cystoscopy on 11/20/12 for chronic pelvic pain and dysmenorrhea. See op note for details. Her post-op course was uncomplicated. Patient was discharged home after meeting all post-op milestones.      OR PROCEDURE: Total laparoscopic hysterectomy  Bilateral salpingectomy  Cystoscopy   PENDING PATHOLOGY RESULTS: Final surgical pathology  SIGNIFICANT MED CHANGES: Yes  Percocet, Colace, Motrin      Signed: Lanier Prude, MD  On: 11/21/2012  at: 1:17 PM

## 2012-11-21 NOTE — Student Note (Cosign Needed)
GYN POST-OP CHECK:   S: Patient complaining of sore throat. Pain is well controlled with percocet, Toradol, ibuprofen. Pain is primarily located to right lateral incision site. Percocet causes mild pruritus; patient tolerates this. Denies chest pain, shortness of breath, nausea, vomiting. Tolerating regular diet. Foley removed, urine output is adequate. Patient is ambulating. Flatus: present. No bowel movements.    Vitals:   Temp:  [36 C (96.8 F)-37.6 C (99.7 F)] 36.5 C (97.7 F)  Heart Rate:  [64-96] 64  Resp:  [12-23] 16  BP: (96-125)/(56-71) 111/63 mmHg    I/Os:   I/O last 3 completed shifts:  11/09 2300 - 11/10 2259  In: 2390 (44.7 mL/kg) [P.O.:480; I.V.:1910 (1.5 mL/kg/hr)]  Out: 3550 (66.4 mL/kg) [Urine:3525 (2.7 mL/kg/hr); Blood:25]  Net: -1160  Weight used: 53.5 kg  I/O this shift:  11/10 2300 - 11/11 0659  In: - (0 mL/kg)   Out: 1175 (22 mL/kg) [Urine:1175]  Net: -1175  Weight used: 53.5 kg    Physical Exam:   HEENT: Normocephalic; atraumatic   Cardiovascular: Regular rate and rhythm with no murmurs   Respiratory: Clear to auscultate, no wheezes, rhonchi, or rales.  Abdomen: Soft, appropriately tender in RLQ and LLQ around incisions, nondistended, no guarding, no rebound, bandages in place on laparoscopic wounds.   Extremities/Skin: No edema noted, SCDs in place, warm and well perfused, cap refill <3 seconds  Bandage: Clean, dry and intact     A/P: 42 y.o. female on POD#1 s/p TLH, BS, cystourethrography. Doing well, ambulating. Pain well controlled. Good UOP. +Flatus. Good p.o. Intake. No n/v.     Pain  --Percocet, toradol, ibuprofen    Migraine headache  --Currently no HA  --Imitrex 50 mg per home regimen     Dysphagia  --Cepastat lozenge PRN    Post Op   1) Continue routine postop care   2) Increase ambulation   3) Dispo: Likely d/c home today    Prophylaxis  1) CLABSI: Central line/PICC day #: N/A   2) UTI: Foley: Removed   3) VAP: N/A   4) GI: Regular diet  5) DVT: d/c heparin as patient is  OOB    Kristen Loader  11/21/2012 6:23 AM    Page ID [phone]    This is a medical student note for educational purposes only. Please refer to Attending's note for final evaluation and plan.

## 2012-11-21 NOTE — Progress Notes (Signed)
GYN Progress Note:  POD #1    S:  Patient is doing well. Reports pain has been controlled with percocet. +Ambulation. Tolerating PO well. + flatus. Foley out, has voided twice. Denies n/v, f/c, CP, dyspnea. Feels ready for discharge home this morning.    O:   BP 113/56  Pulse 68  Temp(Src) 36.3 C (97.3 F) (Temporal)  Resp 16  Ht 1.65 m (5' 4.96")  Wt 53.5 kg (117 lb 15.1 oz)  BMI 19.65 kg/m2  SpO2 97%  LMP 10/19/2012    VS Range:   Temp:  [36 C (96.8 F)-37.6 C (99.7 F)] 36.3 C (97.3 F)  Heart Rate:  [64-96] 68  Resp:  [12-23] 16  BP: (96-125)/(56-71) 113/56 mmHg    I/Os:      Intake/Output Summary (Last 24 hours) at 11/21/12 0838  Last data filed at 11/21/12 0530   Gross per 24 hour   Intake   2390 ml   Output   5500 ml   Net  -3110 ml       Physical Exam:  General: NAD, A&O x 3  Cardiovascular: RRR  Respiratory: CTAB  Abdomen: Soft, ND. Min TTP. +BS  Extremities/Skin: NT. No edema. SCDs on  Incision: c/d/i x 3, covered with steri strips      A/P:  42yo female on POD# 1 s/p total laparoscopic hysterectomy, bilateral salpingectomy, excision/ablation of endometriosis, and cystoscopy for chronic pelvic pain and dysmenorrhea. Doing well. Intra operative findings were reviewed with patient.     - Continue routine postop care  - Pain control: Percocet prn/toradol --> motrin prn. Continue Gabapentin 600mg  TID.   - Meeting all post operative milestones  - PPX: Heparin q8h, SCDs, early ambulation  - Reviewed discharge instructions  - Dispo: D/c home later today    Hassan Rowan, MD  [facility] Fellow

## 2012-11-23 LAB — SURGICAL PATHOLOGY

## 2012-11-24 ENCOUNTER — Telehealth: Payer: Self-pay

## 2012-11-24 ENCOUNTER — Ambulatory Visit: Payer: Self-pay | Admitting: Obstetrics and Gynecology

## 2012-11-24 NOTE — Telephone Encounter (Signed)
Message from pt stating that she had a pneumonia and flu vaccines on Monday, along with a hysterectomy.  Pt states her arm is very sore and the injection site is swollen and getting bigger.  Tc to pt, LMTCB.

## 2012-11-24 NOTE — Telephone Encounter (Signed)
Pt states the area is about 4 cm across and red and swollen.  She has been using ice packs and taking ibuprofen.  Informed pt she could take an antihistamine as well.  Instructed pt to continue with ice packs and ibuprofen and call or go to urgent care if worsens or persists.  Pt agreeable.

## 2012-11-27 ENCOUNTER — Telehealth: Payer: Self-pay | Admitting: School

## 2012-11-27 NOTE — Telephone Encounter (Signed)
Tc from pt stating that she has only a couple doses of percocet left.  She has vicodin as her maintenance med but she can only tolerate 1/2 tab.  If she takes a full tablet, she has to take benadryl as it causes her to itch.  This makes her more groggy.  Explained that the pain providers are not in the office this week. Pt is also alternating with 600 mg ibuprofen every 6 hours.  Will c/w S. Geen tomorrow.  Pt  States that she is taking 10 days of clindamycin for the cellulitis.  Pt states she was offered the flu shot and pneumonia vaccine before she was discharged from the hospital.  She is not sure why she was asked but she decided to go for it.  Regretting this now.  States it was better yesterday but it is a little more painful today.  Pt is applying ice packs periodically.  To call if not improved.

## 2012-11-27 NOTE — Telephone Encounter (Signed)
Pt Desiree Cunningham stating she went to urgent care on Saturday and was diagnosed with cellulitis and treated with antibiotics.  Pt also asking for 1-2 more days worth of percocet for post op pain.  LMTCB

## 2012-11-28 NOTE — Telephone Encounter (Signed)
This patient needs to be seen and evaluated for the cellulitis at her PCP office -   If the pain is post op and this is why she is requesting more percocet - then we should write for more.

## 2012-11-28 NOTE — Telephone Encounter (Signed)
Tc to pt, left message for pt to call.

## 2012-12-05 NOTE — Telephone Encounter (Signed)
Tc to pt, LMTCB.

## 2012-12-05 NOTE — Telephone Encounter (Signed)
Pt returned the call.  States she is doing well.  She continues to take vicodin for her back pain but is healing well after the hysterectomy.  Has post-op appt scheduled.

## 2012-12-18 ENCOUNTER — Other Ambulatory Visit: Payer: Self-pay | Admitting: School

## 2012-12-18 NOTE — Telephone Encounter (Signed)
Pt requesting a letter to [facility] at [employer] this Thursday.  States she would be going back at 11 hours per week.  Letter can be faxed to [employer] at [phone].  Pt states she is feeling good and no surgical pain.  She does still have some back pain and is requesting a refill on ibuprofen.

## 2012-12-19 MED ORDER — IBUPROFEN 600 MG PO TABS *I*
600.0000 mg | ORAL_TABLET | Freq: Three times a day (TID) | ORAL | Status: DC | PRN
Start: 2012-12-18 — End: 2013-09-19

## 2012-12-19 NOTE — Telephone Encounter (Signed)
Letter written

## 2012-12-20 NOTE — Telephone Encounter (Signed)
LM letter faxed to [employer].

## 2012-12-29 ENCOUNTER — Ambulatory Visit: Payer: Self-pay | Admitting: Obstetrics and Gynecology

## 2012-12-29 ENCOUNTER — Encounter: Payer: Self-pay | Admitting: Obstetrics and Gynecology

## 2012-12-29 VITALS — BP 112/80 | Ht 65.0 in | Wt 117.6 lb

## 2012-12-29 DIAGNOSIS — G8929 Other chronic pain: Secondary | ICD-10-CM

## 2012-12-29 DIAGNOSIS — Z9079 Acquired absence of other genital organ(s): Secondary | ICD-10-CM

## 2012-12-29 DIAGNOSIS — Z90721 Acquired absence of ovaries, unilateral: Secondary | ICD-10-CM

## 2012-12-29 DIAGNOSIS — R102 Pelvic and perineal pain: Secondary | ICD-10-CM

## 2012-12-29 DIAGNOSIS — Z90710 Acquired absence of both cervix and uterus: Secondary | ICD-10-CM

## 2012-12-29 DIAGNOSIS — M7918 Myalgia, other site: Secondary | ICD-10-CM

## 2012-12-29 MED ORDER — LIDOCAINE 5 % EX OINT *I*
TOPICAL_OINTMENT | CUTANEOUS | Status: AC
Start: 2012-12-29 — End: ?

## 2012-12-29 MED ORDER — DIAZEPAM 10 MG PO TABS *I*
ORAL_TABLET | ORAL | Status: DC
Start: 2012-12-29 — End: 2013-03-23

## 2012-12-29 NOTE — Progress Notes (Signed)
Chief Complaint:  Post-op exam    History of Present Illness:  Desiree Cunningham is a 42 y.o. G0 who presents for post-op exam after undergoing TLH on 11/20/12 for chronic pelvic pain and presumed endometriosis, which was confirmed at the time of surgery.    Post-operative complications: No  Using pain medications: Yes - at baseline    Operative Findings:  Surgical Pathology FINAL DIAGNOSIS:  (A) Broad ligament #1, right posterior, biopsy:  - Mild reactive changes.    (B) Broad ligament #2, right posterior, biopsy:  - Endometriosis.    (C) Broad ligament #3, right posterior, biopsy:  - Mild reactive changes.    (D) Soft tissue, right posterior cul-de-sac, biopsy:  - Endometriosis.    (E) Rectum, endometriosis, biopsy:  - Fibroadipose tissue.  - No endometriosis identified.    (F) Soft tissue, left uterine sacral endometriosis, biopsy:  - Consistent with endometriosis.    (G) Ligament, left posterior, biopsy:  - Reactive changes.  - No definitive endometriosis identified.    (H) Uterus, cervix and bilateral fallopian tubes, resection:  - Cervix:  - Mild chronic cervicitis.  - No dysplasia identified.  - Endometrium:  - Proliferative-type endometrium.  - No hyperplasia or malignancy identified.  - Myometrium:  - Leiomyomas.  - Endometriosis.  - Fallopian tubes, bilateral:  - Paratubal cysts.        Physical Exam:  BP 112/80  Ht 1.651 m (5\' 5" )  Wt 53.343 kg (117 lb 9.6 oz)  BMI 19.57 kg/m2  LMP 10/19/2012  General: NAD  Abdomen: flat with well-healed laparoscopy incisions.  Soft.  Nontender  External Genitalia: Normal.  No lesions.  Normal BUS.   Speculum exam: Minimal blood at top of vault.  Vaginal cuff healing normally  Bimanual exam: Cuff is nontender and intact.    Assessment:  Normal post-op exam    Patient Active Problem List   Diagnosis Code   . Arthritis 716.90   . Anxiety 300.00   . Irritable bowel syndrome 564.1   . Severe Menstrual Pain (Dysmenorrhea) 625.3   . Migraine Headache 346.90   . Myofascial pain  syndrome 729.1   . 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 Chronic pelvic pain in female 625.9   . S/P laproscopic hysterectomy and left salpingo-oophorectomy V88.01, V45.77       Plan:  She is released from post-op care.  TPI done today.   Next visit in 4-6 weeks for TPI.

## 2012-12-29 NOTE — Procedures (Signed)
Trigger Point Injections    Reason for injection: Myofascial pain with trigger points    Area to be injected:    Abdomen: no   Back: yes   Pelvic floor: no    Location of trigger points:          Number of trigger points injected: 8    Number of muscles involved: >3    Anesthetic Agent: Bupivacaine 0.25%    Total Volume of Local Anesthetic: 8 ml    Pre-Injection Pain Level:  Pain    12/29/12 1339   PainSc:   4   PainLoc: Back       Post-Injection Pain Level: 1    Adverse Reaction: no    Amaree Leeper, MD

## 2013-02-16 ENCOUNTER — Encounter: Payer: Self-pay | Admitting: Obstetrics and Gynecology

## 2013-02-16 ENCOUNTER — Ambulatory Visit: Payer: Self-pay | Admitting: Obstetrics and Gynecology

## 2013-02-16 VITALS — BP 100/64 | Ht 66.0 in | Wt 117.8 lb

## 2013-02-16 DIAGNOSIS — R102 Pelvic and perineal pain: Secondary | ICD-10-CM

## 2013-02-16 DIAGNOSIS — G8929 Other chronic pain: Secondary | ICD-10-CM

## 2013-02-16 MED ORDER — HYDROCODONE-ACETAMINOPHEN 5-325 MG PO TABS *I*
ORAL_TABLET | ORAL | Status: DC
Start: 2013-02-16 — End: 2013-05-04

## 2013-02-16 NOTE — Progress Notes (Signed)
CHIEF COMPLAINT   Follow up abdominopelvic and back pain      PRESENT ILLNESS  Desiree Cunningham is a 43 y.o. female who is in today for continued management of chronic pain.      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    Average pain level: 3 out of 10.     Level of physical activity and mobility: Good .    Has felt that she needed additional pain medication: irregularly.    Since her last office visit, she has gone to the emergency room for pain 0 times.    Quality of her sleep in the past month: Fair .    Mood in the past month: Good .    Side effects of her medications for pain in the past month have been none.    Evidence of drug hoarding, diversion, or misuse: no  Evidence of addictive behavior: no     Her interval medical history is otherwise unchanged.      REVIEW OF SYSTEMS  All systems reviewed and negative except:  As above  Night sweats  Fatigue  Headaches  Urinary leakage and difficulty voiding  Joint and muscle pain      CURRENT MEDICATIONS   Current Outpatient Prescriptions on File Prior to Visit   Medication Sig Dispense Refill    diazepam (VALIUM) 10 MG tablet Take 1 tablet every 12 hours  MDD:2  60 tablet  0    lidocaine (XYLOCAINE) 5 % ointment Apply sparingly to painful area of abdomen and back 2 times per day.  37.5 g  5    Melatonin 3 MG CAPS Take 1 tablet by mouth nightly as needed           diclofenac (VOLTAREN) 1 % gel Apply topically 4  times daily as needed   Apply 4 g to affected area 4 times daily.        fluticasone (FLONASE) 50 MCG/ACT nasal spray 1 spray by Nasal route daily as needed           escitalopram (LEXAPRO) 5 MG tablet Take 5 mg by mouth at bedtime           loratadine (CLARITIN) 10 MG tablet Take 10 mg by mouth daily as needed           SUMAtriptan (IMITREX) 25 MG tablet Take 25 mg by mouth as needed   Take at onset of headache. May repeat once in 2 hours.          No current facility-administered medications on file prior to visit.          ALLERGIES  Allergies   Allergen Reactions    Latex Dermatitis    Kiwi Extract Shortness Of Breath    Levaquin  Hives    Cephalosporins Other (See Comments)     Reaction Unknown         Past medical, surgical, and social history were reviewed and updated in the medical record.      PHYSICAL EXAM   BP 100/64   Ht 1.676 m (5\' 6" )   Wt 53.434 kg (117 lb 12.8 oz)   BMI 19.02 kg/m2   LMP 10/19/2012   Pain    02/16/13 1414   PainSc:   3   PainLoc: Pelvic       General: Well-appearing, well-groomed female in no apparent distress.      Psych: She does not appear sedated.  Her interactions are appropriate.  Her affect is normal.  Her mood is normal.     Back: trigger points as noted below        Pelvic: not indicated      ASSESSMENT/PLAN    43 y.o. 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  Norco refilled.  No changes.      Next visit in 6 weeks.     This note was done with the use of a template.

## 2013-02-16 NOTE — Procedures (Signed)
Trigger Point Injections    Reason for injection: Myofascial pain with trigger points    Area to be injected:    Abdomen: no   Back: yes   Pelvic floor: no    Location of trigger points:          Number of trigger points injected: 8    Number of muscles involved: >2    Anesthetic Agent: Bupivacaine 0.25%    Total Volume of Local Anesthetic: 10 ml    Pre-Injection Pain Level:  Pain    02/16/13 1414   PainSc:   3   PainLoc: Pelvic       Post-Injection Pain Level: 0    Adverse Reaction: no    Jiles Goya, MD

## 2013-03-23 ENCOUNTER — Encounter: Payer: Self-pay | Admitting: Obstetrics and Gynecology

## 2013-03-23 ENCOUNTER — Ambulatory Visit: Payer: Self-pay | Admitting: Obstetrics and Gynecology

## 2013-03-23 VITALS — BP 112/70 | Ht 64.0 in | Wt 114.2 lb

## 2013-03-23 DIAGNOSIS — G8929 Other chronic pain: Secondary | ICD-10-CM

## 2013-03-23 DIAGNOSIS — G4700 Insomnia, unspecified: Secondary | ICD-10-CM

## 2013-03-23 DIAGNOSIS — R102 Pelvic and perineal pain: Secondary | ICD-10-CM

## 2013-03-23 DIAGNOSIS — F411 Generalized anxiety disorder: Secondary | ICD-10-CM

## 2013-03-23 DIAGNOSIS — F1190 Opioid use, unspecified, uncomplicated: Secondary | ICD-10-CM

## 2013-03-23 MED ORDER — AMITRIPTYLINE HCL 25 MG PO TABS *I*
ORAL_TABLET | ORAL | Status: DC
Start: 2013-03-23 — End: 2013-05-04

## 2013-03-23 MED ORDER — DIAZEPAM 10 MG PO TABS *I*
ORAL_TABLET | ORAL | Status: DC
Start: 2013-03-23 — End: 2013-05-04

## 2013-03-23 NOTE — Progress Notes (Signed)
Chronic Pelvic Pain Practice Follow Up Visit    CHIEF COMPLAINT   Chief Complaint   Patient presents with    Pelvic Pain     TPI       PRESENT ILLNESS  Desiree Cunningham is a 43 y.o. female who is in today for continued management of chronic abdominopelvic and back pain.      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    Average pain level: 3-4 out of 10.     Level of physical activity and mobility: Poor.    Has felt that the need additional pain medication: daily.    Emergency room visits for pain since last visit: 0.    Quality of sleep in the past month: Poor.    Mood in the past month: Good .    Side effects of medications for pain in the past month: none.    Evidence of drug hoarding, diversion, or misuse: no  Evidence of addictive behavior: no     Interval medical history is otherwise unchanged.       REVIEW OF SYSTEMS  All systems reviewed and negative except:  Headaches  Diarrhea, constipation  Joint, muscle, and back      CURRENT MEDICATIONS   Current Outpatient Prescriptions on File Prior to Visit   Medication Sig Dispense Refill    HYDROcodone-acetaminophen (NORCO) 5-325 MG per tablet Take 1-2 tablets by mouth every 6 hours as needed for pain. MDD 6 tablets.  180 tablet  0    lidocaine (XYLOCAINE) 5 % ointment Apply sparingly to painful area of abdomen and back 2 times per day.  37.5 g  5     Melatonin 3 MG CAPS Take 1 tablet by mouth nightly as needed           diclofenac (VOLTAREN) 1 % gel Apply topically 4 times daily as needed   Apply 4 g to affected area 4 times daily.        fluticasone (FLONASE) 50 MCG/ACT nasal spray 1 spray by Nasal route daily as needed           escitalopram (LEXAPRO) 5 MG tablet Take 5 mg by mouth at bedtime           loratadine (CLARITIN) 10 MG tablet Take 10 mg by mouth daily as needed           SUMAtriptan (IMITREX) 25 MG tablet Take 25 mg by mouth as needed   Take at onset of headache. May repeat once in 2 hours.          No current facility-administered medications on file prior to visit.          ALLERGIES  Allergies  Allergen Reactions    Latex Dermatitis    Kiwi Extract Shortness Of Breath    Levaquin Hives    Cephalosporins Other (See Comments)     Reaction Unknown         Past medical, surgical, and social history were reviewed and updated in the medical record.      PHYSICAL EXAM   BP 112/70    Ht 1.626 m (5\' 4" )    Wt 51.801 kg (114 lb 3.2 oz)    BMI 19.59 kg/m2      LMP 10/19/2012      Pain    03/23/13 1409   PainSc:   3   PainLoc: Pelvic       General: Well-appearing, well-groomed, in no apparent distress.      Psych: Not sedated. Interactions are appropriate. Affect is normal.  Mood is normal.     Back: trigger points as below      Pelvic: not indicated      ASSESSMENT/PLAN    Desiree Cunningham is a 43 y.o. G0P0000 with chronic abdominopelvic and back pain:    TPI done with good effect.  Diazepam refill done.   Will try amitriptyline for insomnia and chronic pain.  Rx sent.      Next visit in 6 weeks.     This note was done with the use of a template.         Trigger Point Injections    Reason for injection: Myofascial pain with trigger points    Area to be injected:    Abdomen: no   Back: yes   Pelvic floor: no    Location of trigger points:          Number of trigger points injected: 8    Number of muscles involved: 4    Anesthetic Agent: Bupivacaine  0.25%    Total Volume of Local Anesthetic: 9 ml    Pre-Injection Pain Level:  Pain    03/23/13 1409   PainSc:   3   PainLoc: Pelvic       Post-Injection Pain Level: 1    Adverse Reaction: no    Gareld Obrecht, MD

## 2013-05-04 ENCOUNTER — Ambulatory Visit: Payer: Self-pay | Admitting: Obstetrics and Gynecology

## 2013-05-04 ENCOUNTER — Encounter: Payer: Self-pay | Admitting: Obstetrics and Gynecology

## 2013-05-04 VITALS — BP 112/80 | Ht 64.0 in | Wt 118.2 lb

## 2013-05-04 DIAGNOSIS — G8929 Other chronic pain: Secondary | ICD-10-CM

## 2013-05-04 DIAGNOSIS — R102 Pelvic and perineal pain: Secondary | ICD-10-CM

## 2013-05-04 DIAGNOSIS — M7918 Myalgia, other site: Secondary | ICD-10-CM

## 2013-05-04 DIAGNOSIS — F1190 Opioid use, unspecified, uncomplicated: Secondary | ICD-10-CM

## 2013-05-04 MED ORDER — GABAPENTIN 300 MG PO CAPS
ORAL_CAPSULE | ORAL | Status: DC
Start: 2013-05-04 — End: 2013-06-15

## 2013-05-04 MED ORDER — DIAZEPAM 10 MG PO TABS *I*
ORAL_TABLET | ORAL | Status: DC
Start: 2013-05-04 — End: 2013-06-15

## 2013-05-04 MED ORDER — HYDROCODONE-ACETAMINOPHEN 5-325 MG PO TABS *I*
ORAL_TABLET | ORAL | Status: DC
Start: 2013-05-04 — End: 2013-06-15

## 2013-06-04 NOTE — Procedures (Signed)
Trigger Point Injections    Reason for injection: Myofascial pain with trigger points    Area to be injected:    Abdomen: yes   Back: yes   Pelvic floor: no    Location of trigger points:          Number of trigger points injected: 8    Number of muscles involved: 6    Anesthetic Agent: Bupivacaine 0.25%    Total Volume of Local Anesthetic: 10 ml    Pre-Injection Pain Level:  Pain    05/04/13 1407   PainSc:   4   PainLoc: Pelvic       Post-Injection Pain Level: 0    Adverse Reaction: no    Sylvan Sookdeo, MD

## 2013-06-04 NOTE — Progress Notes (Signed)
Chronic Pel[facility] Pain Clinic  Follow Up Visit    CHIEF COMPLAINT  Follow up abdominopelvic pain      PRESENT ILLNESS  Dorice Fleischmann is a 43 y.o. who is in today for continued management of chronic abdominopelvic and back pain.  She did not tolerate amitriptyline.     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    Severe Menstrual Pain (Dysmenorrhea) 01/14/2010    Migraine Headache 01/14/2010    Arthritis 01/13/2010    Anxiety 01/13/2010     Lexapro      Irritable bowel syndrome 01/13/2010       Since her last visit her pain has: increased somewhat    Pain score (out of 10):  Current:  3  Worst in the last month:  7  Least in the last month:  0  Average over the past month:  4    Over the past month:  Level of physical activity or mobility:  Poor  Family relationships:  Good   Social relationships:  Fair   Quality of sleep:  Poor  Bowel function:  Fair   Bladder function:  Fair   Intercourse:  N/A    Pain medication usage:  Takes pain medication:  Twice a day  Has felt the need extra pain medication:  every 2-3 days.    Side effects:  Nausea:  none  Vomiting:  none  Constipation:  none  Itching:  mild  Mental Cloudiness:  none  Sweating:  none  Fatigue:  none  Drowsiness:  none    Potential aberrant behavior:  Purposeful oversedation:  no  Negative mood change:  no  Appears intoxicated:  no  Increasingly unkempt or impaired:  no  Involvement in car or other accident:  no  Requests frequent early renewals:  no  Increased dose without authorization:  no  Reports lost or stolen prescriptions:  no  Attempts to obtain  prescriptions from other doctors:  no  Changes route of administration:  no  Uses pain medication in response to situational stress:  no  Insists on certain medications by name:  no  Contact with street drug culture:  no  Abusing alcohol or illicit drugs:  no  Hoarding of medication:  no  Arrested by the police:  no  Victim of abuse:  no    Emergency room visits for pain since last visit:  0.    Interval medical history is otherwise unchanged.    Current Outpatient Prescriptions on File Prior to Visit   Medication Sig Dispense Refill    lidocaine (XYLOCAINE) 5 % ointment Apply sparingly to painful area of abdomen and back 2 times per day.  37.5 g  5    Melatonin 3 MG CAPS Take 1 tablet by mouth nightly as needed           diclofenac (VOLTAREN) 1 % gel Apply topically 4 times daily as needed   Apply 4 g to affected area 4 times daily.  fluticasone (FLONASE) 50 MCG/ACT nasal spray 1 spray by Nasal route daily as needed           escitalopram (LEXAPRO) 5 MG tablet Take 5 mg by mouth at bedtime           loratadine (CLARITIN) 10 MG tablet Take 10 mg by mouth daily as needed           SUMAtriptan (IMITREX) 25 MG tablet Take 25 mg by mouth as needed   Take at onset of headache. May repeat once in 2 hours.          No current facility-administered medications on file prior to visit.       Allergies   Allergen Reactions    Latex Dermatitis    Kiwi Extract Shortness Of Breath    Levaquin Hives    Cephalosporins Other (See Comments)     Reaction Unknown         PHYSICAL EXAM   BP 112/80    Ht 1.626 m (5\' 4" )    Wt 53.615 kg (118 lb 3.2 oz)    BMI 20.28 kg/m2      LMP 10/19/2012    Pain    05/04/13 1407   PainSc:   4   PainLoc: Pelvic     General: Well-appearing. Well-groomed. Does not appear sedated.  Psych: Interactions appropriate.  Affect is normal.  Mood is normal.  Pulmonary: Unlabored respiratory effort.  Abdomen and back: Trigger points as noted below      Pelvic: not indicated      ASSESSMENT &  PLAN    Blase MessHeather Vazquez is a 43 y.o. with chronic abdominopelvic and back pain.    Trigger points injections done.  Did not tolerate amitriptyline - discontinued already.  Will try gabapenting - tolerated for post-op pain at time of hysterectomy.    Return to clinic in 6 weeks.    Jarely Juncaj, MD

## 2013-06-15 ENCOUNTER — Other Ambulatory Visit: Payer: Self-pay | Admitting: Obstetrics and Gynecology

## 2013-06-15 ENCOUNTER — Encounter: Payer: Self-pay | Admitting: Obstetrics and Gynecology

## 2013-06-15 ENCOUNTER — Ambulatory Visit: Payer: Self-pay | Admitting: Obstetrics and Gynecology

## 2013-06-15 VITALS — BP 100/70 | Ht 66.0 in | Wt 116.4 lb

## 2013-06-15 DIAGNOSIS — R102 Pelvic and perineal pain: Secondary | ICD-10-CM

## 2013-06-15 DIAGNOSIS — N6459 Other signs and symptoms in breast: Secondary | ICD-10-CM

## 2013-06-15 DIAGNOSIS — M7918 Myalgia, other site: Secondary | ICD-10-CM

## 2013-06-15 DIAGNOSIS — G8929 Other chronic pain: Secondary | ICD-10-CM

## 2013-06-15 DIAGNOSIS — N6452 Nipple discharge: Secondary | ICD-10-CM

## 2013-06-15 DIAGNOSIS — F1190 Opioid use, unspecified, uncomplicated: Secondary | ICD-10-CM

## 2013-06-15 MED ORDER — HYDROCODONE-ACETAMINOPHEN 5-325 MG PO TABS *I*
ORAL_TABLET | ORAL | Status: DC
Start: 2013-06-15 — End: 2013-07-19

## 2013-06-15 MED ORDER — DIAZEPAM 10 MG PO TABS *I*
ORAL_TABLET | ORAL | Status: DC
Start: 2013-06-15 — End: 2013-07-19

## 2013-06-15 MED ORDER — GABAPENTIN 100 MG PO CAPS
ORAL_CAPSULE | ORAL | Status: AC
Start: 2013-06-15 — End: ?

## 2013-06-15 NOTE — Progress Notes (Signed)
Chronic Pelvic Pain Clinic  Follow Up Visit    CHIEF COMPLAINT  Follow up abdominopelvic and back pain      PRESENT ILLNESS  Toshi Wion is a 43 y.o. who is in today for continued management of chronic abdominopelvic pain.    She is planning to move to [place] later this summer.   Abdominal TPI done at last visit were not helpful.     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    Migraine Headache 01/14/2010    Arthritis 01/13/2010    Anxiety 01/13/2010     Lexapro      Irritable bowel syndrome 01/13/2010       Since her last visit her pain has: decreased somehwat    Pain score (out of 10):  Current:  4  Worst in the last month:  8  Least in the last month:  0  Average over the past month:  4    Over the past month:  Level of physical activity or mobility:  Poor  Family relationships:  Good   Social relationships:  Poor  Quality of sleep:  Poor  Bowel function:  Fair   Bladder function:  Fair   Intercourse:  N/A    Pain medication usage:  Takes pain medication:  Daily as prescribed  Has felt the need extra pain medication:  every 2-3 days.    Side effects:  Nausea:  none  Vomiting:  none  Constipation:  none  Itching:  none  Mental Cloudiness:  mild  Sweating:  none  Fatigue:  none  Drowsiness:  moderate    Potential aberrant behavior:  Purposeful oversedation:  no  Negative mood change:  no  Appears intoxicated:  no  Increasingly unkempt or impaired:  no  Involvement in car or other accident:  no  Requests frequent early renewals:  no  Increased dose without authorization:  no  Reports lost or stolen  prescriptions:  no  Attempts to obtain prescriptions from other doctors:  no  Changes route of administration:  no  Uses pain medication in response to situational stress:  no  Insists on certain medications by name:  no  Contact with street drug culture:  no  Abusing alcohol or illicit drugs:  no  Hoarding of medication:  no  Arrested by the police:  no  Victim of abuse:  no    Emergency room visits for pain since last visit:  0.    Interval medical history is otherwise unchanged.    Current Outpatient Prescriptions on File Prior to Visit   Medication Sig Dispense Refill    lidocaine (XYLOCAINE) 5 % ointment Apply sparingly to painful area of abdomen and back 2 times per day.  37.5 g  5    Melatonin 3 MG CAPS Take 1 tablet by mouth nightly as needed           diclofenac (VOLTAREN) 1 % gel Apply topically 4 times daily as needed   Apply 4 g  to affected area 4 times daily.        fluticasone (FLONASE) 50 MCG/ACT nasal spray 1 spray by Nasal route daily as needed           escitalopram (LEXAPRO) 5 MG tablet Take 5 mg by mouth at bedtime           loratadine (CLARITIN) 10 MG tablet Take 10 mg by mouth daily as needed           SUMAtriptan (IMITREX) 25 MG tablet Take 25 mg by mouth as needed   Take at onset of headache. May repeat once in 2 hours.          No current facility-administered medications on file prior to visit.       Allergies   Allergen Reactions    Latex Dermatitis    Kiwi Extract Shortness Of Breath    Levaquin Hives    Cephalosporins Other (See Comments)     Reaction Unknown         PHYSICAL EXAM   BP 100/70    Ht 1.676 m (5\' 6" )    Wt 52.799 kg (116 lb 6.4 oz)    BMI 18.80 kg/m2      LMP 10/19/2012    Pain    06/15/13 1332   PainSc:   4   PainLoc: Pelvic     General: Well-appearing. Well-groomed. Does not appear sedated.  Psych: Interactions appropriate.  Affect is normal.  Mood is normal.  Pulmonary: Unlabored respiratory effort.  Back: Trigger points as noted below      Pelvic: not  indicated      ASSESSMENT & PLAN    Blase MessHeather Heine is a 43 y.o. with chronic abdominopelvic and back pain.    TPI done.  Norco and diazepam refilled.  Requested lower dose of gabapentin to titrate dose up.   Mommogram ordered per patient request.      Return to clinic in 4 weeks.    Billy Rocco, MD

## 2013-07-19 ENCOUNTER — Encounter: Payer: Self-pay | Admitting: Obstetrics and Gynecology

## 2013-07-19 ENCOUNTER — Ambulatory Visit
Admit: 2013-07-19 | Discharge: 2013-07-19 | Disposition: A | Discharge status: Home / Self Care | Payer: Self-pay | Source: Ambulatory Visit | Attending: Obstetrics and Gynecology | Admitting: Obstetrics and Gynecology

## 2013-07-19 ENCOUNTER — Ambulatory Visit: Payer: Self-pay | Admitting: Obstetrics and Gynecology

## 2013-07-19 VITALS — BP 102/70 | Ht 66.0 in | Wt 120.0 lb

## 2013-07-19 DIAGNOSIS — G8929 Other chronic pain: Secondary | ICD-10-CM

## 2013-07-19 DIAGNOSIS — R102 Pelvic and perineal pain: Secondary | ICD-10-CM

## 2013-07-19 MED ORDER — HYDROCODONE-ACETAMINOPHEN 5-325 MG PO TABS *I*
ORAL_TABLET | ORAL | Status: DC
Start: 2013-07-19 — End: 2013-08-14

## 2013-07-19 MED ORDER — DIAZEPAM 10 MG PO TABS *I*
ORAL_TABLET | ORAL | Status: DC
Start: 2013-07-19 — End: 2013-08-14

## 2013-07-19 NOTE — Procedures (Signed)
Trigger Point Injections    Reason for injection: Myofascial pain with trigger points    Area to be injected:    Abdomen: no   Back: yes   Pelvic floor: no    Location of trigger points:    2 in left shoulder  6 in left low-mid back    Number of trigger points injected: 8    Number of muscles involved: >3    Anesthetic Agent: Bupivacaine 0.25%    Total Volume of Local Anesthetic: 8 ml    Pre-Injection Pain Level:  Pain    07/19/13 1359   PainSc:   5   PainLoc: Pelvic       Post-Injection Pain Level: 0    Adverse Reaction: no    Desiree Fraley, MD

## 2013-07-19 NOTE — Progress Notes (Signed)
Chronic Pelvic Pain Clinic  Follow Up Visit    CHIEF COMPLAINT  Follow up abdominopelvic pain      PRESENT ILLNESS  Desiree Cunningham is a 43 y.o. who is in today for continued management of chronic abdominopelvic pain.      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    Migraine Headache 01/14/2010    Arthritis 01/13/2010    Anxiety 01/13/2010     Lexapro      Irritable bowel syndrome 01/13/2010       Since her last visit her pain has: increased somewhat    Pain score (out of 10):  Current:  5  Worst in the last month:  8  Least in the last month:  1  Average over the past month:  4    Over the past month:  Level of physical activity or mobility:  Fair   Family relationships:  Good   Social relationships:  Fair   Quality of sleep:  Poor  Bowel function:  Fair   Bladder function:  Fair   Intercourse:  N/A    Pain medication usage:  Takes pain medication:  Norco 2-3 times a day  Has felt the need extra pain medication:  daily.    Side effects:  Nausea:  none  Vomiting:  none  Constipation:  none  Itching:  none  Mental Cloudiness:  moderate  Sweating:  none  Fatigue:  none  Drowsiness:  mild    Potential aberrant behavior:  Purposeful oversedation:  no  Negative mood change:  no  Appears intoxicated:  no  Increasingly unkempt or impaired:  no  Involvement in car or other accident:  no  Requests frequent early renewals:  no  Increased dose without authorization:  no  Reports lost or stolen prescriptions:  no  Attempts to obtain prescriptions from other doctors:  no  Changes route of administration:  no  Uses pain medication in  response to situational stress:  no  Insists on certain medications by name:  no  Contact with street drug culture:  no  Abusing alcohol or illicit drugs:  no  Hoarding of medication:  no  Arrested by the police:  no  Victim of abuse:  no    Emergency room visits for pain since last visit:  0.    Interval medical history is otherwise unchanged.  [place].      Current Outpatient Prescriptions on File Prior to Visit   Medication Sig Dispense Refill    gabapentin (NEURONTIN) 100 MG capsule One capsule by mouth at bedtime.  Increase as tolerated to 3 capsules at night.  90 capsule  5    lidocaine (XYLOCAINE) 5 % ointment Apply sparingly to painful area of abdomen and back 2 times per day.  37.5 g  5    Melatonin 3 MG CAPS Take 1 tablet by mouth nightly as needed           diclofenac (  VOLTAREN) 1 % gel Apply topically 4 times daily as needed   Apply 4 g to affected area 4 times daily.        fluticasone (FLONASE) 50 MCG/ACT nasal spray 1 spray by Nasal route daily as needed           escitalopram (LEXAPRO) 5 MG tablet Take 5 mg by mouth at bedtime           loratadine (CLARITIN) 10 MG tablet Take 10 mg by mouth daily as needed           SUMAtriptan (IMITREX) 25 MG tablet Take 25 mg by mouth as needed   Take at onset of headache. May repeat once in 2 hours.          No current facility-administered medications on file prior to visit.       Allergies   Allergen Reactions    Latex Dermatitis    Kiwi Extract Shortness Of Breath    Levaquin Hives    Cephalosporins Other (See Comments)     Reaction Unknown         PHYSICAL EXAM   BP 102/70    Ht 1.676 m (5\' 6" )    Wt 54.432 kg (120 lb)    BMI 19.38 kg/m2      LMP 10/19/2012      Pain    07/19/13 1359   PainSc:   5   PainLoc: Pelvic     General: Well-appearing. Well-groomed. Does not appear sedated.  Psych: Interactions appropriate.  Affect is normal.  Mood is normal.  Pulmonary: Unlabored respiratory effort.  Trigger points  identified in back.       ASSESSMENT & PLAN    Desiree Cunningham is a 43 y.o. with chronic abdominopelvic pain.    TPI done.  Norco and diazepam refills done.  She will try to schedule an appointment when she will be here in September.  Otherwise I have given her contact information for [name] at [facility] for continued care.      Daleyza Gadomski, MD

## 2013-07-31 ENCOUNTER — Other Ambulatory Visit: Payer: Self-pay | Admitting: Radiology

## 2013-08-14 ENCOUNTER — Other Ambulatory Visit: Payer: Self-pay

## 2013-08-14 DIAGNOSIS — G8929 Other chronic pain: Secondary | ICD-10-CM

## 2013-08-14 DIAGNOSIS — R102 Pelvic and perineal pain: Secondary | ICD-10-CM

## 2013-08-14 NOTE — Telephone Encounter (Signed)
Pt Lm requesting presc's for diazepam and norco. Is going OOT on 8/8 and would like to pick up on 8/7 if possible. Presc's actually due on 8/7.  Last filled: 07/19/13  Last appt: 7/9  Next appt: 9/18  Send to [facility].

## 2013-08-15 MED ORDER — DIAZEPAM 10 MG PO TABS *I*
ORAL_TABLET | ORAL | Status: DC
Start: 2013-08-15 — End: 2013-09-28

## 2013-08-15 MED ORDER — HYDROCODONE-ACETAMINOPHEN 5-325 MG PO TABS *I*
ORAL_TABLET | ORAL | Status: DC
Start: 2013-08-15 — End: 2013-09-28

## 2013-09-19 ENCOUNTER — Other Ambulatory Visit: Payer: Self-pay | Admitting: Obstetrics and Gynecology

## 2013-09-21 NOTE — Telephone Encounter (Signed)
Next appt:09/28/13.

## 2013-09-28 ENCOUNTER — Other Ambulatory Visit: Payer: Self-pay | Admitting: School

## 2013-09-28 ENCOUNTER — Ambulatory Visit: Payer: Self-pay | Admitting: Obstetrics and Gynecology

## 2013-09-28 DIAGNOSIS — R102 Pelvic and perineal pain: Secondary | ICD-10-CM

## 2013-09-28 DIAGNOSIS — G8929 Other chronic pain: Secondary | ICD-10-CM

## 2013-09-28 MED ORDER — DIAZEPAM 10 MG PO TABS *I*
ORAL_TABLET | ORAL | Status: AC
Start: 2013-09-28 — End: ?

## 2013-09-28 MED ORDER — HYDROCODONE-ACETAMINOPHEN 5-325 MG PO TABS *I*
ORAL_TABLET | ORAL | Status: AC
Start: 2013-09-28 — End: ?

## 2013-09-28 NOTE — Telephone Encounter (Signed)
Pt LM requesting refill on hydrocodone and valium.  Last appt:07/19/13, Next appt:transferring care to [facility] and will need to confirm if appointment scheduled, Last refill:8/7  LMTCB

## 2013-09-28 NOTE — Telephone Encounter (Signed)
Patient currently waiting on employment in [place], still has house here. Notified her that she should make an appointment with Dr Sharlet Salina if she is not going to be relocated as office will not be able to provide prescription refills beyond October. Patient expresses confidence that she will have an appointment with her new provider in October.

## 2014-01-15 ENCOUNTER — Telehealth: Payer: Self-pay | Admitting: School

## 2014-01-15 NOTE — Telephone Encounter (Signed)
Pt would like a referral to Dr[facility] Charyl [facility]ardo Kapural at [facility].  She says there is a referral form on their website that can be completed and faxed back.  Website: [email or web address]  Informed pt would notify Dr. Sharlet SalinaBenjamin.

## 2014-01-23 ENCOUNTER — Encounter: Payer: Self-pay | Admitting: Gastroenterology

## 2014-08-04 DIAGNOSIS — H17813 Minor opacity of cornea, bilateral: Secondary | ICD-10-CM | POA: Insufficient documentation

## 2014-08-21 ENCOUNTER — Encounter: Payer: Self-pay | Admitting: Radiology

## 2015-02-01 DIAGNOSIS — D649 Anemia, unspecified: Secondary | ICD-10-CM | POA: Insufficient documentation

## 2015-03-23 DIAGNOSIS — J4520 Mild intermittent asthma, uncomplicated: Secondary | ICD-10-CM | POA: Insufficient documentation

## 2015-03-23 DIAGNOSIS — R011 Cardiac murmur, unspecified: Secondary | ICD-10-CM | POA: Insufficient documentation

## 2015-03-23 DIAGNOSIS — R922 Inconclusive mammogram: Secondary | ICD-10-CM | POA: Insufficient documentation

## 2015-03-23 DIAGNOSIS — D734 Cyst of spleen: Secondary | ICD-10-CM | POA: Insufficient documentation

## 2015-03-23 DIAGNOSIS — D1803 Hemangioma of intra-abdominal structures: Secondary | ICD-10-CM | POA: Insufficient documentation

## 2015-03-23 DIAGNOSIS — K7689 Other specified diseases of liver: Secondary | ICD-10-CM | POA: Insufficient documentation

## 2015-06-19 ENCOUNTER — Other Ambulatory Visit: Payer: Self-pay | Admitting: Obstetrics and Gynecology

## 2015-06-19 DIAGNOSIS — G8929 Other chronic pain: Secondary | ICD-10-CM

## 2015-06-19 DIAGNOSIS — R102 Pelvic and perineal pain: Secondary | ICD-10-CM

## 2015-09-25 DIAGNOSIS — R102 Pelvic and perineal pain unspecified side: Secondary | ICD-10-CM | POA: Insufficient documentation

## 2015-09-25 DIAGNOSIS — M6289 Other specified disorders of muscle: Secondary | ICD-10-CM | POA: Insufficient documentation

## 2015-09-25 DIAGNOSIS — G8929 Other chronic pain: Secondary | ICD-10-CM | POA: Insufficient documentation

## 2015-09-25 DIAGNOSIS — IMO0002 Reserved for concepts with insufficient information to code with codable children: Secondary | ICD-10-CM | POA: Insufficient documentation

## 2015-09-25 DIAGNOSIS — N803 Endometriosis of pelvic peritoneum: Secondary | ICD-10-CM | POA: Insufficient documentation

## 2015-09-29 DIAGNOSIS — K5901 Slow transit constipation: Secondary | ICD-10-CM | POA: Insufficient documentation

## 2015-09-29 DIAGNOSIS — M62838 Other muscle spasm: Secondary | ICD-10-CM | POA: Insufficient documentation

## 2015-09-29 DIAGNOSIS — G8929 Other chronic pain: Secondary | ICD-10-CM | POA: Insufficient documentation

## 2015-09-29 DIAGNOSIS — R350 Frequency of micturition: Secondary | ICD-10-CM | POA: Insufficient documentation

## 2015-10-01 ENCOUNTER — Other Ambulatory Visit: Payer: Self-pay | Admitting: Obstetrics and Gynecology

## 2015-10-01 DIAGNOSIS — IMO0002 Reserved for concepts with insufficient information to code with codable children: Secondary | ICD-10-CM

## 2015-10-01 DIAGNOSIS — N803 Endometriosis of pelvic peritoneum: Secondary | ICD-10-CM

## 2015-10-17 ENCOUNTER — Ambulatory Visit
Admission: RE | Admit: 2015-10-17 | Discharge: 2015-10-17 | Disposition: A | Discharge status: Home / Self Care | Payer: Managed Care, Other (non HMO) | Source: Ambulatory Visit | Attending: Obstetrics and Gynecology | Admitting: Obstetrics and Gynecology

## 2015-10-17 DIAGNOSIS — N803 Endometriosis of pelvic peritoneum: Secondary | ICD-10-CM

## 2015-10-17 DIAGNOSIS — IMO0002 Reserved for concepts with insufficient information to code with codable children: Secondary | ICD-10-CM

## 2015-10-17 MED ORDER — GADOBENATE DIMEGLUMINE 529 MG/ML IV SOLN
9.0000 mL | Freq: Once | INTRAVENOUS | Status: AC | PRN
  Administered 2015-10-17: 9 mL via INTRAVENOUS

## 2015-12-03 DIAGNOSIS — N3010 Interstitial cystitis (chronic) without hematuria: Secondary | ICD-10-CM | POA: Insufficient documentation

## 2016-01-22 ENCOUNTER — Other Ambulatory Visit: Payer: Self-pay | Admitting: Family Medicine

## 2016-01-22 DIAGNOSIS — Z1231 Encounter for screening mammogram for malignant neoplasm of breast: Secondary | ICD-10-CM

## 2016-02-20 ENCOUNTER — Ambulatory Visit: Payer: Managed Care, Other (non HMO)

## 2016-03-12 ENCOUNTER — Ambulatory Visit
Admission: RE | Admit: 2016-03-12 | Discharge: 2016-03-12 | Disposition: A | Discharge status: Home / Self Care | Payer: Managed Care, Other (non HMO) | Source: Ambulatory Visit | Attending: Family Medicine | Admitting: Family Medicine

## 2016-03-12 DIAGNOSIS — Z1231 Encounter for screening mammogram for malignant neoplasm of breast: Secondary | ICD-10-CM

## 2017-05-16 ENCOUNTER — Other Ambulatory Visit: Payer: Self-pay | Admitting: Family Medicine

## 2017-05-16 DIAGNOSIS — Z1231 Encounter for screening mammogram for malignant neoplasm of breast: Secondary | ICD-10-CM

## 2017-06-10 ENCOUNTER — Ambulatory Visit: Payer: Managed Care, Other (non HMO)

## 2017-06-14 ENCOUNTER — Ambulatory Visit: Payer: BLUE CROSS/BLUE SHIELD | Admitting: Sports Medicine

## 2017-06-14 ENCOUNTER — Encounter: Payer: Self-pay | Admitting: Sports Medicine

## 2017-06-14 ENCOUNTER — Ambulatory Visit (INDEPENDENT_AMBULATORY_CARE_PROVIDER_SITE_OTHER): Payer: BLUE CROSS/BLUE SHIELD

## 2017-06-14 VITALS — BP 112/75 | HR 83

## 2017-06-14 DIAGNOSIS — M2012 Hallux valgus (acquired), left foot: Secondary | ICD-10-CM | POA: Diagnosis not present

## 2017-06-14 DIAGNOSIS — M79671 Pain in right foot: Secondary | ICD-10-CM

## 2017-06-14 DIAGNOSIS — M2011 Hallux valgus (acquired), right foot: Secondary | ICD-10-CM

## 2017-06-14 DIAGNOSIS — M79672 Pain in left foot: Secondary | ICD-10-CM | POA: Diagnosis not present

## 2017-06-14 NOTE — Progress Notes (Signed)
Subjective: Olivia Shaw is a 47 y.o. female patient who presents to office for evaluation of  Left>Right bunion pain. Patient complains of progressive pain especially over the last year in the left>right foot that starts as pain over the bump with direct pressure and range of motion; patient now has difficulty fitting shoes comfortably. Ranks pain 3-4/10 and is now interferring with daily activities.  Patient has also tried multiple treatments and wants to discuss possible lapidus surgery. Patient denies any other pedal complaints.   Review of Systems  All other systems reviewed and are negative.    Patient Active Problem List   Diagnosis Date Noted  . SINUSITIS, ACUTE NOS 05/13/2006  . DISORDER, DEPRESSIVE NEC 04/26/2006  . 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 BACK PAIN, THORACIC REGION 04/26/2006  . LOW BACK PAIN SYNDROME 04/26/2006  . HEADACHE 04/26/2006    Current Outpatient Medications on File Prior to Visit  Medication Sig Dispense Refill  . cetirizine (ZYRTEC) 10 MG tablet Take by mouth.    . diazepam (VALIUM) 5 MG tablet Take 5 mg by mouth every 12 (twelve) hours as needed.  5  . diclofenac sodium (VOLTAREN) 1 % GEL PLACE ONTO THE SKIN 4 (FOUR) TIMES A DAY AS NEEDED.    . rizatriptan (MAXALT-MLT) 10 MG disintegrating tablet DISSOLVE 1 TABLET IN MOUTH AT HEADACHE ONSET. MAY REPEAT ONCE IN 2 HRS IF NEEDED. MAX 2/24 HOURS  2  . traMADol (ULTRAM) 50 MG tablet Take 50 mg by mouth every 6 (six) hours as needed.  5  . triamcinolone cream (KENALOG) 0.1 % APPLY TO AFFECTED AREA TWO TIMES DAILY AS NEEDED  0   No current facility-administered medications on file prior to visit.     Allergies  Allergen Reactions  . Kiwi Extract Anaphylaxis  . Latex Rash    Objective:  General: Alert and oriented x3 in no acute distress  Dermatology: No open lesions bilateral lower extremities, no webspace macerations, no ecchymosis bilateral, all nails x 10  are well manicured.  Vascular: Dorsalis Pedis and Posterior Tibial pedal pulses 2/4, Capillary Fill Time 3 seconds, (+) pedal hair growth bilateral, no edema bilateral lower extremities, Temperature gradient within normal limits.  Neurology: Gross sensation intact via light touch bilateral, Protective sensation intact  with Semmes Weinstein Monofilament to all pedal sites, Position sense intact, vibratory intact bilateral, Deep tendon reflexes within normal limits bilateral, No babinski sign present bilateral. (-) Tinels sign.   Musculoskeletal: Mild tenderness with palpation L>R bunion deformity, no limitation or crepitus with range of motion, deformity reducible, tracking not trackbound, there is mild 1st ray hypermobility noted bilateral. Midtarsal, Subtalar joint, and ankle joint range of motion is within normal limits. On weightbearing exam, there is decreased 1st MTPJ rom  with functional limitus noted, there is medial arch collapse on weightbearing, rearfoot slight varus/valgus, forefoot slight abduction with HAV deformity supported on ground with no second toe crossover deformity noted however abutting on left.   Gait: Non-Antalgic gait with increased medial arch collapse and pronatory influence noted on Left>Right foot with medial 1st MTPJ roll-off at toe-off, heel off within normal limits.   Xrays  Right/Left Foot    Impression: Intermetatarsal angle above normal limits L>R with elevatus. No other acute findings.      Assessment and Plan: Problem List Items Addressed This Visit    None    Visit Diagnoses    Valgus deformity of both great toes    -  Primary  Relevant Orders   DG Foot Complete Right   DG Foot Complete Left   Foot pain, bilateral       L>R       -Complete examination performed -Xrays reviewed -Discussed treatement options; discussed HAV deformity;conservative and  Surgical management; risks, benefits, alternatives discussed. All patient's questions  answered. -Patient would like to consider lapidus procedure for near future -Dispensed foam bunion padding  -Recommend continue with good supportive shoes and inserts.  -Patient to return to office when ready for surgery consult or sooner if condition worsens.  Landis Martins, DPM

## 2017-06-22 ENCOUNTER — Ambulatory Visit
Admission: RE | Admit: 2017-06-22 | Discharge: 2017-06-22 | Disposition: A | Discharge status: Home / Self Care | Payer: BC Managed Care – PPO | Source: Ambulatory Visit | Attending: Family Medicine | Admitting: Family Medicine

## 2017-06-22 DIAGNOSIS — Z1231 Encounter for screening mammogram for malignant neoplasm of breast: Secondary | ICD-10-CM

## 2017-11-15 DIAGNOSIS — N94819 Vulvodynia, unspecified: Secondary | ICD-10-CM | POA: Insufficient documentation

## 2018-02-03 ENCOUNTER — Ambulatory Visit: Payer: BLUE CROSS/BLUE SHIELD | Admitting: Obstetrics & Gynecology

## 2018-04-03 ENCOUNTER — Encounter: Payer: Self-pay | Admitting: Gastroenterology

## 2018-07-26 ENCOUNTER — Other Ambulatory Visit: Payer: Self-pay | Admitting: Family Medicine

## 2018-07-26 DIAGNOSIS — Z1231 Encounter for screening mammogram for malignant neoplasm of breast: Secondary | ICD-10-CM

## 2018-09-08 ENCOUNTER — Ambulatory Visit
Admission: RE | Admit: 2018-09-08 | Discharge: 2018-09-08 | Disposition: A | Discharge status: Home / Self Care | Payer: BC Managed Care – PPO | Source: Ambulatory Visit | Attending: Family Medicine | Admitting: Family Medicine

## 2018-09-08 ENCOUNTER — Other Ambulatory Visit: Payer: Self-pay

## 2018-09-08 DIAGNOSIS — Z1231 Encounter for screening mammogram for malignant neoplasm of breast: Secondary | ICD-10-CM

## 2018-10-06 ENCOUNTER — Ambulatory Visit (INDEPENDENT_AMBULATORY_CARE_PROVIDER_SITE_OTHER): Payer: BC Managed Care – PPO | Admitting: Sports Medicine

## 2018-10-06 ENCOUNTER — Ambulatory Visit (INDEPENDENT_AMBULATORY_CARE_PROVIDER_SITE_OTHER): Payer: BC Managed Care – PPO

## 2018-10-06 ENCOUNTER — Other Ambulatory Visit: Payer: Self-pay

## 2018-10-06 ENCOUNTER — Encounter: Payer: Self-pay | Admitting: Sports Medicine

## 2018-10-06 DIAGNOSIS — M545 Low back pain, unspecified: Secondary | ICD-10-CM | POA: Insufficient documentation

## 2018-10-06 DIAGNOSIS — M5442 Lumbago with sciatica, left side: Secondary | ICD-10-CM

## 2018-10-06 MED ORDER — HYDROCODONE-ACETAMINOPHEN 5-325 MG PO TABS: 1.0000 | ORAL_TABLET | Freq: Three times a day (TID) | ORAL | 0 refills | Status: DC | PRN

## 2018-10-06 NOTE — Assessment & Plan Note (Addendum)
Acute left-sided low back pain after a fall with radiation down the left leg in an L4 distribution. X-rays of the lumbar spine, sacrum and pelvis. Low-dose hydrocodone, she can use ibuprofen on top of this. Return to see me in 2 weeks.  Persistent discomfort, question new sacral fracture/coccygeal fracture, angulation seen on x-rays. I do need to know if this is new, we are going to proceed with an MRI looking for increased T2 signal at the sacrococcygeal junction. Considering procedural planning I am also going to order a lumbar spine MRI.

## 2018-10-06 NOTE — Progress Notes (Addendum)
Subjective:    CC: Left posterior hip pain  HPI:  Approximately 1.5 weeks ago this pleasant 48 year old female was balancing on an exercise board, she fell onto her left hip, since then she is had moderate to severe pain deep in the left hip on the lateral sacrum.  She has started to get some discomfort radiating down her leg to the anterior left lower leg in an L4 distribution.  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  I reviewed the past medical history, family history, social history, surgical history, and allergies today and no changes were needed.  Please see the problem list section below in epic for further details.  Past Medical History: No past medical history on file. Past Surgical History: No past surgical history on file. Social History: Social History   Socioeconomic History  . Marital status: Single    Spouse name: Not on file  . Number of children: Not on file  . Years of education: Not on file  . Highest education level: Not on file  Occupational History  . Not on file  Social Needs  . Financial resource strain: Not on file  . Food insecurity    Worry: Not on file    Inability: Not on file  . Transportation needs    Medical: Not on file    Non-medical: Not on file  Tobacco Use  . Smoking status: Never Smoker  . Smokeless tobacco: Never Used  Substance and Sexual Activity  . Alcohol use: Yes    Comment: occs.  . Drug use: Never  . Sexual activity: Not on file  Lifestyle  . Physical activity    Days per week: Not on file    Minutes per session: Not on file  . Stress: Not on file  Relationships  . Social Herbalist on phone: Not on file    Gets together: Not on file    Attends religious service: Not on file    Active member of club or organization: Not on file    Attends meetings of clubs or organizations: Not on file    Relationship status: Not on file  Other Topics Concern  . Not on file   Social History Narrative  . Not on file   Family History: No family history on file. Allergies: Allergies  Allergen Reactions  . Kiwi Extract Anaphylaxis  . Latex Rash   Medications: See med rec.  Review of Systems: No headache, visual changes, nausea, vomiting, diarrhea, constipation, dizziness, abdominal pain, skin rash, fevers, chills, night sweats, swollen lymph nodes, weight loss, chest pain, body aches, joint swelling, muscle aches, shortness of breath, mood changes, visual or auditory hallucinations.  Objective:    General: Well Developed, well nourished, and in no acute distress.  Neuro: Alert and oriented x3, extra-ocular muscles intact, sensation grossly intact.  HEENT: Normocephalic, atraumatic, pupils equal round reactive to light, neck supple, no masses, no lymphadenopathy, thyroid nonpalpable.  Skin: Warm and dry, no rashes noted.  Cardiac: Regular rate and rhythm, no murmurs rubs or gallops.  Respiratory: Clear to auscultation bilaterally. Not using accessory muscles, speaking in full sentences.  Abdominal: Soft, nontender, nondistended, positive bowel sounds, no masses, no organomegaly.  Left hip: ROM IR: 60 Deg, ER: 60 Deg, Flexion: 120 Deg, Extension: 100 Deg, Abduction: 45 Deg, Adduction: 45 Deg Strength IR: 5/5, ER: 5/5, Flexion: 5/5, Extension: 5/5, Abduction: 5/5, Adduction: 5/5 Pelvic alignment unremarkable to inspection and palpation. Standing hip  rotation and gait without trendelenburg / unsteadiness. Greater trochanter without tenderness to palpation. No tenderness over piriformis. No SI joint tenderness and normal minimal SI movement. Back Exam:  Inspection: Unremarkable  Motion: Flexion 45 deg, Extension 45 deg, Side Bending to 45 deg bilaterally,  Rotation to 45 deg bilaterally  SLR laying: Negative  XSLR laying: Negative  Palpable tenderness: Left side of the sacrum.Marland Kitchen FABER: negative. Sensory change: Gross sensation intact to all lumbar and  sacral dermatomes.  Reflexes: 2+ at both patellar tendons, 2+ at achilles tendons, Babinski's downgoing.  Strength at foot  Plantar-flexion: 5/5 Dorsi-flexion: 5/5 Eversion: 5/5 Inversion: 5/5  Leg strength  Quad: 5/5 Hamstring: 5/5 Hip flexor: 5/5 Hip abductors: 5/5  Gait unremarkable.  Impression and Recommendations:    The patient was counselled, risk factors were discussed, anticipatory guidance given.  Low back pain Acute left-sided low back pain after a fall with radiation down the left leg in an L4 distribution. X-rays of the lumbar spine, sacrum and pelvis. Low-dose hydrocodone, she can use ibuprofen on top of this. Return to see me in 2 weeks.  Persistent discomfort, question new sacral fracture/coccygeal fracture, angulation seen on x-rays. I do need to know if this is new, we are going to proceed with an MRI looking for increased T2 signal at the sacrococcygeal junction. Considering procedural planning I am also going to order a lumbar spine MRI.   ___________________________________________ Gwen Her. Dianah Field, M.D., ABFM., CAQSM. Primary Care and Sports Medicine [facility]  Adjunct Professor of [employer] [facility] [employer] [employer]

## 2018-10-09 NOTE — Addendum Note (Signed)
Addended by: Silverio Decamp on: 10/09/2018 04:57 PM   Modules accepted: Orders

## 2018-10-17 ENCOUNTER — Other Ambulatory Visit: Payer: BC Managed Care – PPO

## 2018-10-20 ENCOUNTER — Encounter: Payer: Self-pay | Admitting: Sports Medicine

## 2018-10-20 ENCOUNTER — Ambulatory Visit (INDEPENDENT_AMBULATORY_CARE_PROVIDER_SITE_OTHER): Payer: BC Managed Care – PPO | Admitting: Sports Medicine

## 2018-10-20 ENCOUNTER — Other Ambulatory Visit: Payer: Self-pay

## 2018-10-20 DIAGNOSIS — M5442 Lumbago with sciatica, left side: Secondary | ICD-10-CM | POA: Diagnosis not present

## 2018-10-20 DIAGNOSIS — M2012 Hallux valgus (acquired), left foot: Secondary | ICD-10-CM

## 2018-10-20 DIAGNOSIS — M2011 Hallux valgus (acquired), right foot: Secondary | ICD-10-CM | POA: Diagnosis not present

## 2018-10-20 NOTE — Assessment & Plan Note (Signed)
X-rays were unremarkable, pain went away on its own. I would like her to do some PT for her lumbar spine, she does have a bit of what seems to be L5 radicular symptoms into the left foot, declines medications.

## 2018-10-20 NOTE — Progress Notes (Signed)
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  Fortunately this resolved with conservative treatment.  She has also had some pain in both first MTPs, significant hallux valgus bilaterally.  She has tried custom orthotics, NSAIDs, she had a bit of relief with iontophoresis with physical therapy, but ultimately she is interested in bunion correction surgery.  I reviewed the past medical history, family history, social history, surgical history, and allergies today and no changes were needed.  Please see the problem list section below in epic for further details.  Past Medical History: No past medical history on file. Past Surgical History: No past surgical history on file. Social History: Social History   Socioeconomic History  . Marital status: Single    Spouse name: Not on file  . Number of children: Not on file  . Years of education: Not on file  . Highest education level: Not on file  Occupational History  . Not on file  Social Needs  . Financial resource strain: Not on file  . Food insecurity    Worry: Not on file    Inability: Not on file  . Transportation needs    Medical: Not on file    Non-medical: Not on file  Tobacco Use  . Smoking status: Never Smoker  . Smokeless tobacco: Never Used  Substance and Sexual Activity  . Alcohol use: Yes    Comment: occs.  . Drug use: Never  . Sexual activity: Not on file  Lifestyle  . Physical activity    Days per week: Not on file    Minutes per session: Not on file  . Stress: Not on file  Relationships  . Social Herbalist on phone: Not on file    Gets together: Not on file    Attends religious service: Not on file    Active member of club or organization: Not on file    Attends meetings of clubs or organizations: Not on file    Relationship status: Not on file  Other Topics Concern  . Not on file   Social History Narrative  . Not on file   Family History: No family history on file. Allergies: Allergies  Allergen Reactions  . Kiwi Extract Anaphylaxis  . Latex Rash   Medications: See med rec.  Review of Systems: No fevers, chills, night sweats, weight loss, chest pain, or shortness of breath.   Objective:    General: Well Developed, well nourished, and in no acute distress.  Neuro: Alert and oriented x3, extra-ocular muscles intact, sensation grossly intact.  HEENT: Normocephalic, atraumatic, pupils equal round reactive to light, neck supple, no masses, no lymphadenopathy, thyroid nonpalpable.  Skin: Warm and dry, no rashes. Cardiac: Regular rate and rhythm, no murmurs rubs or gallops, no lower extremity edema.  Respiratory: Clear to auscultation bilaterally. Not using accessory muscles, speaking in full sentences. Feet: Bilateral hallux valgus with skin irritation on the left, flexible pes planus.  Impression and Recommendations:    Low back pain X-rays were unremarkable, pain went away on its own. I would like her to do some PT for her lumbar spine, she does have a bit of what seems to be L5 radicular symptoms into the left foot, declines medications.  Hallux valgus, acquired, bilateral With first MTP osteoarthritis. Referral to Dr. Lucia Gaskins for consideration of bunionectomy. Has had multiple types of custom orthotics without  improvement.   ___________________________________________ Gwen Her. Dianah Field, M.D., ABFM., CAQSM. Primary Care and Sports Medicine [facility]  Adjunct Professor of [employer]

## 2018-10-20 NOTE — Assessment & Plan Note (Signed)
With first MTP osteoarthritis. Referral to Dr. Lucia Gaskins for consideration of bunionectomy. Has had multiple types of custom orthotics without improvement.

## 2018-11-14 ENCOUNTER — Ambulatory Visit: Payer: BC Managed Care – PPO | Attending: Sports Medicine | Admitting: Physical Therapy

## 2018-11-14 DIAGNOSIS — M79675 Pain in left toe(s): Secondary | ICD-10-CM | POA: Insufficient documentation

## 2018-11-14 DIAGNOSIS — M5442 Lumbago with sciatica, left side: Secondary | ICD-10-CM | POA: Insufficient documentation

## 2018-11-14 DIAGNOSIS — M6281 Muscle weakness (generalized): Secondary | ICD-10-CM | POA: Insufficient documentation

## 2018-11-21 ENCOUNTER — Other Ambulatory Visit: Payer: Self-pay

## 2018-11-21 ENCOUNTER — Ambulatory Visit: Payer: BC Managed Care – PPO | Admitting: Physical Therapy

## 2018-11-21 ENCOUNTER — Encounter: Payer: Self-pay | Admitting: Physical Therapy

## 2018-11-21 DIAGNOSIS — M5442 Lumbago with sciatica, left side: Secondary | ICD-10-CM

## 2018-11-21 DIAGNOSIS — M6281 Muscle weakness (generalized): Secondary | ICD-10-CM | POA: Diagnosis present

## 2018-11-21 DIAGNOSIS — M79675 Pain in left toe(s): Secondary | ICD-10-CM | POA: Diagnosis present

## 2018-11-21 NOTE — Therapy (Signed)
[facility] [address], [place], [place], [address] Phone: [phone]   Fax:  [phone]  Physical Therapy Evaluation  Patient Details  Name: Olivia Shaw MRN: [id number] Date of Birth: 08/26/1970 Referring Provider (PT): Dr. Dianah Field   Encounter Date: 11/21/2018  PT End of Session - 11/21/18 1937    Visit Number  1    Date for PT Re-Evaluation  01/16/19    Authorization Type  BCBS    PT Start Time  1230    PT Stop Time  1325    PT Time Calculation (min)  55 min    Activity Tolerance  Patient tolerated treatment well       History reviewed. No pertinent past medical history.  History reviewed. No pertinent surgical history.  There were no vitals filed for this visit.   Subjective Assessment - 11/21/18 1238    Subjective  I have a Voodoo balance board and fell off landing on right sacrum 4 weeks ago.  Radiating adductor and obturator.  I need iontophoresis on left bunion (going to have next year).  It helped in the past.   Tinels sign on left ankle.   Left back pain, Left medial thigh pain to the knee.  Left foot has an itchy, tingling feeling at night.  Unsure if this is hallux valgus or related to back radicular pattern.    Pertinent History  chronic pelvic pain sees PT at [facility] 2 years Iliopsosas, obturator; intersitial cystitis deep tissue on QL helps;  Had ESI in past;  had success with lumbar traction in the past    Limitations  Sitting;Walking    How long can you sit comfortably?  I can sit as long as I have to;  after sitting 7/10    How long can you walk comfortably?  2 miles    Diagnostic tests  hx of fracture of coccyx as a kid;  frequent left 5th metatarsal fractures    Patient Stated Goals  I would like to have more mobility through T8-L3    Currently in Pain?  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 Dianah Field    Onset Date/Surgical Date  --   4 weeks ago   Next MD Visit  as needed     Prior Therapy  PT for pelvic       Precautions   Precautions  None      Restrictions   Weight Bearing Restrictions  No      Balance Screen   Has the patient fallen in the past 6 months  No    Has the patient had a decrease in activity level because of a fear of falling?   No    Is the patient reluctant to leave their home because of a fear of falling?   No      Home Film/video editor residence      Prior Function   Vocation  Full time employment    Investment banker, operational / teacher  Observation/Other Assessments   Focus on Therapeutic Outcomes (FOTO)   41% limitation       Posture/Postural Control   Posture/Postural Control  No significant limitations    Posture Comments  left hallux valgus       AROM   Overall AROM   --   decreased left great toe mobility    Overall AROM Comments  Full DKTC;inc discomfort with prone press up although improves with reps although elbow pain;  good bil hip mobility     Lumbar Flexion  60    Lumbar Extension  22    Lumbar - Right Side Bend  40    Lumbar - Left Side Bend  28      Strength   Right Hip ABduction  4-/5    Left Hip ABduction  4-/5    Lumbar Flexion  4/5    Lumbar Extension  4/5      Flexibility   Hamstrings  WFLs    Quadriceps  mild decreased bil hip flexor lengths       Palpation   SI assessment   mild inc pain with sacral compression     Palpation comment  right paraspinal spasm;  tender points left QL       Prone Knee Bend Test   Findings  Negative      Straight Leg Raise   Findings  Negative      Gaenslen's test   Findings  Negative      Sacral Compression   Findings  Negative      Hip  Scouring   Findings  Negative                Objective measurements completed on examination: See above findings.              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    Person(s) Educated  Patient    Methods  Explanation    Comprehension  Verbalized understanding       PT Short Term Goals - 11/21/18 1954      PT SHORT TERM GOAL #1   Title  The patient will express understanding of basic self care strategies to aid in healing for lumbar and left foot regions    Time  4    Period  Weeks    Status  New    Target Date  12/19/18      PT SHORT TERM GOAL #2   Title  The patient will report a 30% improvement in back pain with home and work ADLs    Time  4    Period  Weeks    Status  New      PT SHORT TERM GOAL #3   Title  The patient will report a 30% improvement in left great toe pain    Time  4    Period  Weeks    Status  New        PT Long Term Goals - 11/21/18 1959      PT LONG TERM GOAL #1   Title  The patient will be independent with safe self progression of HEP    Time  8    Period  Weeks    Status  New    Target Date  01/16/19      PT LONG TERM GOAL #2   Title  The patient will have improved lumbar extension to 28 degrees and left sidebending to 35 degrees needed for home and work ADLs    Time  8    Period  Weeks    Status  New      PT LONG TERM GOAL #3   Title  The patient will have improved bil hip abduction strength to 4-/5 and lumbar/trunk strength to 4+/5 needed for standing, walking with greater ease    Time  8    Period  Weeks    Status  New      PT LONG TERM GOAL #4   Title  FOTO functional outcome score improved from 41% limitation to 27% limitation    Time  8    Period  Weeks    Status  New             Plan - 11/21/18 1938    Clinical Impression Statement  The patient has a long history of chronic  pelvic pain, back and thigh pain in which she has been treated by PT at [facility].  About 4 weeks ago she fell off her Voodoo balance board onto her sacrum.  She reports the  pain in her usual places has been exacerbated and she has tingling in her left foot at night time.  She has a prominent hallux valgus and plans to have surgical fixation in 2021.   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  She is also a Restaurant manager, fast food.  She has decreased left sidebending ROM.  No clear lumbar directional preference.  Decreased bil hip abduction strength.  Decreased QL and and right paraspinal muscle lengths.  She has had success with lumbar traction and iontophoresis to her toe in the past although she is concerned about her lack of insurance coverage for hospital based PT.  She is interested in transferring her care to a non-hospital based PT facility.    Personal Factors and Comorbidities  Past/Current Experience;Comorbidity 1    Comorbidities  chronic pelvic pain and back pain    Examination-Activity Limitations  Sit;Locomotion Level    Examination-Participation Restrictions  Other    Stability/Clinical Decision Making  Stable/Uncomplicated    Clinical Decision Making  Low    Rehab Potential  Good    PT Frequency  2x / week    PT Duration  8 weeks    PT Treatment/Interventions  ADLs/Self Care Home Management;Cryotherapy;Electrical Stimulation;Ultrasound;Traction;Moist Heat;Iontophoresis 4mg /ml Dexamethasone;Therapeutic activities;Therapeutic exercise;Neuromuscular re-education;Manual techniques;Patient/family education;Dry needling;Taping;Spinal Manipulations;Joint Manipulations    PT Next Visit Plan  pt considering DN to lumbar multifidi and left QL;  lumbar traction; spinal manual therapy (flexion/rotation) mob or manipulation;  initiate glute medius strengthening;  iontophoresis to left great toe    Recommended  Other Services  transfer of care to PT at [place] and Agree with Plan of Care  Patient       Patient will benefit from skilled therapeutic intervention in order to improve the following deficits and impairments:  Decreased range of motion, Increased fascial restricitons, Increased muscle spasms, Pain, Decreased strength  Visit Diagnosis: Acute left-sided low back pain with left-sided sciatica - Plan: PT plan of care cert/re-cert  Pain in left toe(s) - Plan: PT plan of care cert/re-cert  Muscle weakness (generalized) - Plan: PT plan of care cert/re-cert     Problem List Patient Active Problem List   Diagnosis  Date Noted  . Hallux valgus, acquired, bilateral 10/20/2018  . Low back pain 10/06/2018  . SINUSITIS, ACUTE NOS 05/13/2006  . DISORDER, DEPRESSIVE NEC 04/26/2006  . 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 BACK PAIN, THORACIC REGION 04/26/2006  . LOW BACK PAIN SYNDROME 04/26/2006  . HEADACHE 04/26/2006   Ruben Im, PT 11/21/18 8:05 PM Phone: [phone] Fax: [phone] Alvera Singh 11/21/2018, 8:04 PM  [facility] [address] [place], [place], [address] Phone: [phone]   Fax:  [phone]  Name: Olivia Shaw MRN: [id number] Date of Birth: 08-29-1970

## 2018-12-01 ENCOUNTER — Ambulatory Visit (INDEPENDENT_AMBULATORY_CARE_PROVIDER_SITE_OTHER): Payer: BC Managed Care – PPO | Admitting: Osteopathic Medicine

## 2018-12-01 ENCOUNTER — Encounter: Payer: Self-pay | Admitting: Osteopathic Medicine

## 2018-12-01 VITALS — HR 92 | Temp 98.1°F | Ht 65.0 in | Wt 117.0 lb

## 2018-12-01 DIAGNOSIS — N3010 Interstitial cystitis (chronic) without hematuria: Secondary | ICD-10-CM | POA: Diagnosis not present

## 2018-12-01 DIAGNOSIS — F5104 Psychophysiologic insomnia: Secondary | ICD-10-CM

## 2018-12-01 MED ORDER — DICLOFENAC SODIUM 1 % EX GEL: 4.0000 g | Freq: Four times a day (QID) | CUTANEOUS | 11 refills | Status: DC

## 2018-12-01 MED ORDER — RIZATRIPTAN BENZOATE 5 MG PO TBDP: 5.0000 mg | ORAL_TABLET | ORAL | 1 refills | Status: AC | PRN

## 2018-12-01 MED ORDER — MONTELUKAST SODIUM 10 MG PO TABS: 10.0000 mg | ORAL_TABLET | Freq: Every day | ORAL | 3 refills | Status: DC

## 2018-12-01 MED ORDER — LIDOCAINE 5 % EX OINT: TOPICAL_OINTMENT | Freq: Three times a day (TID) | CUTANEOUS | 11 refills | Status: DC | PRN

## 2018-12-01 NOTE — Patient Instructions (Signed)
Plan:   Will plan on getting labs w/ next annual physical sometime in the next 6 months or so! OK to bring papers for foster/adopt physical to that visit. Labs ok to get ahead of visit, so we can go over results when I see you! Call us a week prior to your visit to I can be sure to have lab orders in place.
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
## 2018-12-12 ENCOUNTER — Ambulatory Visit: Payer: BC Managed Care – PPO | Admitting: Physical Therapy

## 2018-12-19 ENCOUNTER — Telehealth: Payer: Self-pay

## 2018-12-19 NOTE — Telephone Encounter (Signed)
I don't typically send antibiotics without a visit. My schedule being slammed now, I don't think we can fit her in officially but can we call her back and find out how long her symptoms have been going on and what she's tried so far? E-visit through [facility] also available (rather than virtual visit w/ me)

## 2018-12-19 NOTE — Telephone Encounter (Signed)
Pt left a vm msg stating she thinks she is having a sinus infection. Requesting whether she needs an appt or can provider send in antibiotics into the pharmacy? No other symptoms mentioned during vm. Pls advise, thanks.

## 2018-12-20 NOTE — Telephone Encounter (Signed)
Patient did go to an urgent care last night. She was diagnosed with a sinus infection and given an antibiotic.

## 2018-12-25 ENCOUNTER — Other Ambulatory Visit: Payer: Self-pay

## 2018-12-26 ENCOUNTER — Ambulatory Visit (INDEPENDENT_AMBULATORY_CARE_PROVIDER_SITE_OTHER): Payer: BC Managed Care – PPO | Admitting: Physical Therapy

## 2018-12-26 DIAGNOSIS — M79675 Pain in left toe(s): Secondary | ICD-10-CM

## 2018-12-26 DIAGNOSIS — M6281 Muscle weakness (generalized): Secondary | ICD-10-CM

## 2018-12-26 DIAGNOSIS — M546 Pain in thoracic spine: Secondary | ICD-10-CM

## 2018-12-26 DIAGNOSIS — M5442 Lumbago with sciatica, left side: Secondary | ICD-10-CM

## 2018-12-27 ENCOUNTER — Encounter: Payer: Self-pay | Admitting: Physical Therapy
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
## 2018-12-28 ENCOUNTER — Telehealth: Payer: Self-pay | Admitting: Physical Therapy

## 2018-12-28 NOTE — Telephone Encounter (Signed)
Coding Department can you all look into this and see if you can help? Patient is unsure why she received a bill at all.

## 2018-12-28 NOTE — Telephone Encounter (Signed)
-----   Message from Payne sent at 12/28/2018 12:12 PM EST ----- Please look into the following billing question below.   Patient has already called billing and was told:   Patient Name: Olivia Shaw MRN: [id number] DOB:[birth year] Date of Service: Amount: $52 copay and then received a bill for $100 or $150  She went to [facility] on 11/10 for an Eval // Then she had an Eval here with Lauren on 12/15.   Patient was advised that this process can take up to a week or more. Patient also was advised that they may receive additional bills while this is being looked into and they are to hold onto all statements until resolved.

## 2018-12-29 NOTE — Telephone Encounter (Signed)
HI,   I don't see that she is getting a bill from [place].  She did not see [facility] that I see in her chart on 11/10.  It was a [facility] reahab.  Her charges from 12/15 have not even been billed yet.  So she is not getting a bill from Lauren's visit either.  Let me know if I can help further.  Thanks, [employer]

## 2018-12-29 NOTE — Telephone Encounter (Signed)
I called and left a voicemail for the patient explaining the message below. For any other questions she can call billing at [phone].

## 2019-01-02 ENCOUNTER — Encounter: Payer: BC Managed Care – PPO | Admitting: Physical Therapy

## 2019-01-08 ENCOUNTER — Telehealth: Payer: Self-pay | Admitting: Osteopathic Medicine

## 2019-01-08 DIAGNOSIS — J3489 Other specified disorders of nose and nasal sinuses: Secondary | ICD-10-CM

## 2019-01-08 NOTE — Telephone Encounter (Signed)
She should contact that office and see if a referral is needed or if they are ok to see her as an established patient.

## 2019-01-08 NOTE — Telephone Encounter (Signed)
Patient called and has a pea sized bump in the right side of her nose. It has started draining and was clear and now has a red tint to it. Patient reports that it hurts and it is blocking airflow through her nasal passage.  She reports that she had previously been referred to [facility] [address].  She is wanting to know if she needs an appointment first or are you ok with placing the referral. Please advise.

## 2019-01-08 NOTE — Telephone Encounter (Signed)
I have placed the referral and patient is aware. No other questions.

## 2019-01-09 DIAGNOSIS — J300 Vasomotor rhinitis: Secondary | ICD-10-CM | POA: Insufficient documentation

## 2019-01-09 DIAGNOSIS — J340 Abscess, furuncle and carbuncle of nose: Secondary | ICD-10-CM | POA: Insufficient documentation

## 2019-01-15 ENCOUNTER — Ambulatory Visit: Payer: BC Managed Care – PPO | Admitting: Sports Medicine

## 2019-01-15 ENCOUNTER — Other Ambulatory Visit: Payer: Self-pay

## 2019-01-15 ENCOUNTER — Ambulatory Visit (INDEPENDENT_AMBULATORY_CARE_PROVIDER_SITE_OTHER): Payer: BC Managed Care – PPO

## 2019-01-15 DIAGNOSIS — M5442 Lumbago with sciatica, left side: Secondary | ICD-10-CM

## 2019-01-15 MED ORDER — MELOXICAM 15 MG PO TABS: ORAL_TABLET | ORAL | 3 refills | Status: DC

## 2019-01-15 NOTE — Assessment & Plan Note (Addendum)
Olivia Shaw continues to have back pain, this has been present now since our initial evaluation back in the fall, she had resolution of her symptoms. Now she is having worsening of symptoms in spite of greater than 6 weeks of conservative measures. X-rays showed L5-S1 DDD, she has left L5 and S1 distribution radiculopathy. Adding an MRI today. Meloxicam. We will likely proceed with an epidural after the MRI. She did have her Covid vaccine recently so I would like to wait at least 2 to 3 weeks before doing the epidural.  Left L5 and S1 nerve roots look fine, L4 does look somewhat displaced in the extraforaminal location.  This doesn't match symptoms going to outer foot so I think we should consider a NCS to evaluate if nerve is pinched elsewhere.

## 2019-01-15 NOTE — Progress Notes (Addendum)
    Procedures performed today:    None.  Independent interpretation of tests performed by another provider:   Personally reviewed Shaw x-rays, I do see L5-S1 DDD.  Personally reviewed MRI, left L5 and S1 nerves appear unimpeded, L4 does look like its bumped outward in extraforaminal location but this doesn't correspond to symptoms.  Impression and Recommendations:    Low back pain Olivia Shaw continues to have back pain, this has been present now since our initial evaluation back in the fall, she had resolution of Shaw symptoms. Now she is having worsening of symptoms in spite of greater than 6 weeks of conservative measures. X-rays showed L5-S1 DDD, she has left L5 and S1 distribution radiculopathy. Adding an MRI today. Meloxicam. We will likely proceed with an epidural after the MRI. She did have Shaw Covid vaccine recently so I would like to wait at least 2 to 3 weeks before doing the epidural.  Left L5 and S1 nerve roots look fine, L4 does look somewhat displaced in the extraforaminal location.  This doesn't match symptoms going to outer foot so I think we should consider a NCS to evaluate if nerve is pinched elsewhere.    ___________________________________________ Olivia Shaw. Olivia Shaw, M.D., ABFM., CAQSM. Primary Care and [place] Instructor of [place] of [facility] of Medicine

## 2019-01-16 DIAGNOSIS — M5440 Lumbago with sciatica, unspecified side: Secondary | ICD-10-CM

## 2019-01-19 ENCOUNTER — Ambulatory Visit: Payer: BC Managed Care – PPO | Admitting: Sports Medicine

## 2019-11-09 ENCOUNTER — Encounter: Payer: Self-pay | Admitting: Medical-Surgical

## 2019-11-09 ENCOUNTER — Telehealth (INDEPENDENT_AMBULATORY_CARE_PROVIDER_SITE_OTHER): Payer: BC Managed Care – PPO | Admitting: Medical-Surgical

## 2019-11-09 VITALS — HR 83 | Temp 98.9°F

## 2019-11-09 DIAGNOSIS — J069 Acute upper respiratory infection, unspecified: Secondary | ICD-10-CM

## 2019-11-09 MED ORDER — AZITHROMYCIN 250 MG PO TABS: ORAL_TABLET | ORAL | 0 refills | Status: DC

## 2019-11-09 MED ORDER — BENZONATATE 200 MG PO CAPS: 200.0000 mg | ORAL_CAPSULE | Freq: Two times a day (BID) | ORAL | 0 refills | Status: DC | PRN

## 2019-11-09 NOTE — Progress Notes (Signed)
Virtual Visit via Video Note  I connected with Olivia Shaw on 11/09/19 at  4:00 PM EDT by a video enabled telemedicine application and verified that I am speaking with the correct person using two identifiers.   I discussed the limitations of evaluation and management by telemedicine and the availability of in person appointments. The patient expressed understanding and agreed to proceed.  Patient location: home Provider locations: office  Subjective:    CC: Fever, headache  HPI: Pleasant 49 year old female presenting via MyChart video visit with reports of fever T-max 101 with headache, left ear pain, fatigue, diarrhea x1 day, mild cough, sinus congestion, left-sided facial pain/pressure, and sore throat.  Her symptoms started on Sunday with a significant headache and mild sinus congestion.  By Tuesday she had a fever which was originally intermittent until it became more constant at greater than 99.  She has taken for home Covid tests and all 4 were negative  She did do a PCR test today and is awaiting those results.  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  [place] also [place] and was exposed to a lot of people there.  She has been taking Tylenol and ibuprofen but has not tried over-the-counter cough medications.  Denies shortness of breath, nausea, vomiting, and chest pain.  Past medical history, Surgical history, Family history not pertinant except as noted below, Social history, Allergies, and medications have been entered into the medical record, reviewed, and corrections made.   Review of Systems: See HPI for pertinent positives and negatives.   Objective:    General: Speaking clearly in complete sentences without any shortness of breath.  Alert and oriented x3.  Normal judgment. No apparent acute  distress.  Impression and Recommendations:    1. Upper respiratory tract infection, unspecified type Since all 4 home tests for Covid were negative, low suspicion for Covid.  We will wait on her PCR results to see if it is also negative.  As her symptoms have been present for 5 days and we are going into the weekend, sending in a azithromycin.  Also sending in [facility] for cough management.  Okay to use over-the-counter cold medications if desired.  Recommend increasing rest and fluids.  I discussed the assessment and treatment plan with the patient. The patient was provided an opportunity to ask questions and all were answered. The patient agreed with the plan and demonstrated an understanding of the instructions.   The patient was advised to call back or seek an in-person evaluation if the symptoms worsen or if the condition fails to improve as anticipated.  20 minutes of non-face-to-face time was provided during this encounter.  Return if symptoms worsen or fail to improve.  Clearnce Sorrel, DNP, APRN, FNP-BC [facility]

## 2019-12-20 ENCOUNTER — Other Ambulatory Visit: Payer: Self-pay | Admitting: Nurse Practitioner

## 2019-12-20 DIAGNOSIS — Z1231 Encounter for screening mammogram for malignant neoplasm of breast: Secondary | ICD-10-CM

## 2020-02-04 ENCOUNTER — Ambulatory Visit: Payer: BC Managed Care – PPO

## 2020-03-24 ENCOUNTER — Inpatient Hospital Stay: Admission: RE | Admit: 2020-03-24 | Payer: Self-pay | Source: Ambulatory Visit

## 2020-03-31 DIAGNOSIS — G5620 Lesion of ulnar nerve, unspecified upper limb: Secondary | ICD-10-CM | POA: Insufficient documentation

## 2020-04-02 DIAGNOSIS — R519 Headache, unspecified: Secondary | ICD-10-CM | POA: Insufficient documentation

## 2020-04-02 DIAGNOSIS — Z8709 Personal history of other diseases of the respiratory system: Secondary | ICD-10-CM | POA: Insufficient documentation

## 2020-04-02 DIAGNOSIS — Z87898 Personal history of other specified conditions: Secondary | ICD-10-CM | POA: Insufficient documentation

## 2020-04-13 ENCOUNTER — Emergency Department (HOSPITAL_COMMUNITY): Payer: BC Managed Care – PPO

## 2020-04-13 ENCOUNTER — Encounter (HOSPITAL_COMMUNITY): Payer: Self-pay

## 2020-04-13 ENCOUNTER — Emergency Department (HOSPITAL_COMMUNITY)
Admission: EM | Admit: 2020-04-13 | Discharge: 2020-04-13 | Disposition: A | Discharge status: Home / Self Care | Payer: BC Managed Care – PPO | Attending: Emergency Medicine | Admitting: Emergency Medicine

## 2020-04-13 ENCOUNTER — Other Ambulatory Visit: Payer: Self-pay

## 2020-04-13 DIAGNOSIS — S0990XA Unspecified injury of head, initial encounter: Secondary | ICD-10-CM

## 2020-04-13 DIAGNOSIS — S0081XA Abrasion of other part of head, initial encounter: Secondary | ICD-10-CM | POA: Insufficient documentation

## 2020-04-13 DIAGNOSIS — W228XXA Striking against or struck by other objects, initial encounter: Secondary | ICD-10-CM | POA: Diagnosis not present

## 2020-04-13 DIAGNOSIS — Z91040 Latex allergy status: Secondary | ICD-10-CM | POA: Diagnosis not present

## 2020-04-13 DIAGNOSIS — Z87891 Personal history of nicotine dependence: Secondary | ICD-10-CM | POA: Diagnosis not present
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
## 2020-04-13 NOTE — Discharge Instructions (Signed)
Recommend follow-up with your primary doctor or with our concussion specialist.  Return to ER if you develop vomiting, or other new concerns.

## 2020-04-13 NOTE — ED Provider Notes (Signed)
Patient placed in Quick Look pathway, seen and evaluated   Chief Complaint: head injury, nausea  HPI: Patient presents for evaluation of head injury.  She hit her head on a door jam last night in the left forehead area.  She had a headache and nausea.  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  No photophobia.  ROS: Headache, concentration difficulty  Physical Exam:   Gen: No distress  Neuro: Awake and Alert  Skin: Warm  BP (!) 139/91 (BP Location: Right Arm)   Pulse 72   Temp 97.9 F (36.6 C) (Oral)   Resp 18   SpO2 100%      Focused Exam: Small left forehead contusion, pupils PERRL.    Initiation of care has begun. The patient has been counseled on the process, plan, and necessity for staying for the completion/evaluation, and the remainder of the medical screening examination    Carlisle Cater, Hershal Coria 04/13/20 1842    Lucrezia Starch, MD 04/13/20 2102

## 2020-04-13 NOTE — ED Triage Notes (Signed)
Pt reports she accidentally hit her forehead on a door frame last night. Pt states she began to notice today that she was having trouble focusing and endorses nausea. Pt denies falling, LOC, and blood thinners.

## 2020-04-14 ENCOUNTER — Telehealth: Payer: Self-pay | Admitting: *Deleted

## 2020-04-14 NOTE — Telephone Encounter (Signed)
Transition Care Management Unsuccessful Follow-up Telephone Call  Date of discharge and from where:  04/13/2020 - [facility]  Attempts:  1st Attempt  Reason for unsuccessful TCM follow-up call:  Left voice message

## 2020-04-15 NOTE — Telephone Encounter (Signed)
Transition Care Management Unsuccessful Follow-up Telephone Call  Date of discharge and from where:  04/13/2020 - [facility]  Attempts:  2nd Attempt  Reason for unsuccessful TCM follow-up call:  Left voice message

## 2020-04-16 NOTE — Telephone Encounter (Signed)
Transition Care Management Follow-up Telephone Call  Date of discharge and from where: 04/13/2020 - [facility]  How have you been since you were released from the hospital? "Better"  Any questions or concerns? No  Items Reviewed:  Did the pt receive and understand the discharge instructions provided? No   Medications obtained and verified? No   Other? No   Any new allergies since your discharge? No   Dietary orders reviewed? No  Do you have support at home? Yes    Functional Questionnaire: (I = Independent and D = Dependent) ADLs: I  Bathing/Dressing- I  Meal Prep- I  Eating- I  Maintaining continence- I  Transferring/Ambulation- I  Managing Meds- I  Follow up appointments reviewed:   PCP Hospital f/u appt confirmed? No    Specialist Hospital f/u appt confirmed? No    Are transportation arrangements needed? No   If their condition worsens, is the pt aware to call PCP or go to the Emergency Dept.? Yes  Was the patient provided with contact information for the PCP's office or ED? Yes  Was to pt encouraged to call back with questions or concerns? Yes

## 2020-05-19 ENCOUNTER — Other Ambulatory Visit: Payer: Self-pay

## 2020-05-19 ENCOUNTER — Ambulatory Visit
Admission: RE | Admit: 2020-05-19 | Discharge: 2020-05-19 | Disposition: A | Discharge status: Home / Self Care | Payer: BC Managed Care – PPO | Source: Ambulatory Visit | Attending: Nurse Practitioner | Admitting: Nurse Practitioner

## 2020-05-19 DIAGNOSIS — Z1231 Encounter for screening mammogram for malignant neoplasm of breast: Secondary | ICD-10-CM

## 2020-08-21 ENCOUNTER — Encounter: Payer: Self-pay | Admitting: Sports Medicine

## 2020-08-21 ENCOUNTER — Ambulatory Visit: Payer: BC Managed Care – PPO | Admitting: Sports Medicine

## 2020-08-21 ENCOUNTER — Ambulatory Visit (INDEPENDENT_AMBULATORY_CARE_PROVIDER_SITE_OTHER): Payer: BC Managed Care – PPO

## 2020-08-21 ENCOUNTER — Other Ambulatory Visit: Payer: Self-pay

## 2020-08-21 DIAGNOSIS — M2011 Hallux valgus (acquired), right foot: Secondary | ICD-10-CM

## 2020-08-21 DIAGNOSIS — M21619 Bunion of unspecified foot: Secondary | ICD-10-CM

## 2020-08-21 DIAGNOSIS — M7742 Metatarsalgia, left foot: Secondary | ICD-10-CM

## 2020-08-21 DIAGNOSIS — M21612 Bunion of left foot: Secondary | ICD-10-CM | POA: Diagnosis not present

## 2020-08-21 DIAGNOSIS — M21611 Bunion of right foot: Secondary | ICD-10-CM

## 2020-08-21 DIAGNOSIS — M7741 Metatarsalgia, right foot: Secondary | ICD-10-CM

## 2020-08-21 DIAGNOSIS — M2040 Other hammer toe(s) (acquired), unspecified foot: Secondary | ICD-10-CM

## 2020-08-21 DIAGNOSIS — M79674 Pain in right toe(s): Secondary | ICD-10-CM | POA: Diagnosis not present

## 2020-08-21 DIAGNOSIS — M779 Enthesopathy, unspecified: Secondary | ICD-10-CM

## 2020-08-21 DIAGNOSIS — M2012 Hallux valgus (acquired), left foot: Secondary | ICD-10-CM

## 2020-08-21 DIAGNOSIS — M79675 Pain in left toe(s): Secondary | ICD-10-CM

## 2020-08-21 DIAGNOSIS — S93401A Sprain of unspecified ligament of right ankle, initial encounter: Secondary | ICD-10-CM

## 2020-08-21 NOTE — Progress Notes (Signed)
Subjective: Olivia Shaw is a 49 y.o. female patient who presents to office for evaluation of Left>Right bunion pain. Patient complains of progressive pain especially over the last year in the Left>Right foot that starts as pain over the bump with direct pressure and range of motion that has only continued to worsen with now toe pain and swelling especially at the second MPJ patient has pictures of swelling and bruising to the plantar second toe states that she is very limited in her activities due to pain as well is limited in her shoe choices due to pain.  Patient definitely is agreeable that something more needs to be done because she cannot walk or stand far due to pain in her feet.  Also admits that she twisted her ankle and wants me to check that on the right as well since it is still sore.  Patient denies any other pedal complaints.   Patient Active Problem List   Diagnosis Date Noted   Hallux valgus, acquired, bilateral 10/20/2018   Low back pain 10/06/2018    Current Outpatient Medications on File Prior to Visit  Medication Sig Dispense Refill   acetaminophen (TYLENOL) 500 MG tablet Take 650 mg by mouth every 6 (six) hours as needed.     Albuterol Sulfate (PROAIR RESPICLICK) 123XX123 (90 Base) MCG/ACT AEPB Inhale into the lungs.     Azelastine-Fluticasone 137-50 MCG/ACT SUSP Place 1 spray into both nostrils 2 (two) times daily. (Patient not taking: Reported on 11/09/2019)     azithromycin (ZITHROMAX) 250 MG tablet 2 tabs po x1 on Day 1, then 1 tab po daily on Days 2 - 5 6 tablet 0   benzonatate (TESSALON) 200 MG capsule Take 1 capsule (200 mg total) by mouth 2 (two) times daily as needed for cough. 20 capsule 0   cetirizine (ZYRTEC) 10 MG tablet Take by mouth.     diazepam (VALIUM) 5 MG tablet Take 5 mg by mouth every 12 (twelve) hours as needed.  5   diclofenac Sodium (VOLTAREN) 1 % GEL Apply 4 g topically 4 (four) times daily. 350 g 11   estradiol (VIVELLE-DOT) 0.025 MG/24HR Place 1  patch onto the skin 2 (two) times a week.     fluticasone (FLONASE) 50 MCG/ACT nasal spray Place into both nostrils daily.     Fluticasone Furoate (ARNUITY ELLIPTA) 100 MCG/ACT AEPB INHALE 1 INHALATION BY MOUTH ONCE A DAY     ibuprofen (ADVIL) 200 MG tablet Take 200 mg by mouth every 6 (six) hours as needed.     levocetirizine (XYZAL) 5 MG tablet Take 5 mg by mouth every evening. (Patient not taking: Reported on 11/09/2019)     lidocaine (XYLOCAINE) 5 % ointment Apply topically 3 (three) times daily as needed. 50 g 11   meloxicam (MOBIC) 15 MG tablet One tab PO qAM with a meal for 2 weeks, then daily prn pain. (Patient not taking: Reported on 11/09/2019) 30 tablet 3   montelukast (SINGULAIR) 10 MG tablet Take 1 tablet (10 mg total) by mouth at bedtime. (Patient not taking: Reported on 11/09/2019) 90 tablet 3   rizatriptan (MAXALT-MLT) 5 MG disintegrating tablet Take 1 tablet (5 mg total) by mouth as needed for migraine. May repeat in 2 hours if needed 10 tablet 1   traMADol (ULTRAM) 50 MG tablet Take 50 mg by mouth every 6 (six) hours as needed.  5   triamcinolone cream (KENALOG) 0.1 % APPLY TO AFFECTED AREA TWO TIMES DAILY AS NEEDED (Patient not taking:  Reported on 11/09/2019)  0   No current facility-administered medications on file prior to visit.    Allergies  Allergen Reactions   Kiwi Extract Anaphylaxis   Latex Rash   Levaquin [Levofloxacin] Hives    Objective:  General: Alert and oriented x3 in no acute distress  Dermatology: No open lesions bilateral lower extremities, no webspace macerations, no ecchymosis bilateral, all nails x 10 are well manicured.  No obvious bruising today but patient did show me a picture on her phone where there was some bruising several weeks ago at the plantar aspect of the left second toe.  Vascular: Dorsalis Pedis and Posterior Tibial pedal pulses 2/4, Capillary Fill Time 3 seconds, (+) pedal hair growth bilateral, no edema bilateral lower extremities,  Temperature gradient within normal limits.  Neurology: Johney Maine sensation intact via light touch bilateral.  Musculoskeletal: Mild to moderate tenderness to forefoot at 2-3 toes left>right, Mild tenderness with palpation left>right bunion deformity, no limitation or crepitus with range of motion, deformity reducible, tracking not trackbound, there is mild 1st ray hypermobility noted bilateral.  Midtarsal, Subtalar joint, and ankle joint range of motion is within normal limits. On weightbearing exam, there is decreased 1st MTPJ rom Left >right with functional limitus noted, 35DF, 10 PF, there is medial arch collapse Left >right on weightbearing, rearfoot slight valgus, forefoot slight abduction with HAV deformity supported on ground with no second toe crossover deformity noted but there is impingement and hammertoe deformity starting especially at the second toes left greater than right.  No frank instability noted at the right ankle however there is pain to palpation to the right dorsal lateral ankle likely consistent with sprain.   Xrays  Right/Left Foot    Impression: Intermetatarsal angle above normal limits supportive of bunion with 1st ray elevatus.  There is also flexion contracture of the lesser toes worse at the second toe likely from hallux impingement greater on the left compared to right foot.      Assessment and Plan: Problem List Items Addressed This Visit       Musculoskeletal and Integument   Hallux valgus, acquired, bilateral - Primary   Relevant Orders   Ambulatory referral to Physical Therapy   Other Visit Diagnoses     Capsulitis       Relevant Orders   Ambulatory referral to Physical Therapy   Toe pain, bilateral       Bunion       Relevant Orders   DG Foot Complete Right   DG Foot Complete Left   Metatarsalgia of both feet       Relevant Orders   Ambulatory referral to Physical Therapy   Mild ankle sprain, right, initial encounter       Relevant Orders    Ambulatory referral to Physical Therapy   Hammer toe, unspecified laterality             -Complete examination performed -Xrays reviewed -Discussed treatement options; discussed HAV deformity with toe pain/hammertoe with ankle sprain;conservative and  Surgical management; risks, benefits, alternatives discussed. All patient's questions answered. -Rx physical therapy at [facility] for iontophoresis for pain along these areas until patient can find time for surgery -Advised good supportive shoes rest ice elevation and topical pain creams and rubs as tolerated -Patient to return when ready for surgery consult; patient reports that she wants to wait until December to have surgery. -Patient to return to office as scheduled or sooner if condition worsens.  Landis Martins, DPM

## 2020-08-21 NOTE — Patient Instructions (Signed)
Lapidus procedure for bunion

## 2020-08-27 ENCOUNTER — Ambulatory Visit: Payer: BC Managed Care – PPO

## 2020-09-24 ENCOUNTER — Encounter: Payer: Self-pay | Admitting: Orthopaedic Surgery

## 2020-09-24 ENCOUNTER — Ambulatory Visit: Payer: BC Managed Care – PPO | Admitting: Orthopaedic Surgery

## 2020-09-24 ENCOUNTER — Other Ambulatory Visit: Payer: Self-pay

## 2020-09-24 ENCOUNTER — Ambulatory Visit: Payer: Self-pay

## 2020-09-24 DIAGNOSIS — R600 Localized edema: Secondary | ICD-10-CM

## 2020-09-24 DIAGNOSIS — M5410 Radiculopathy, site unspecified: Secondary | ICD-10-CM | POA: Diagnosis not present

## 2020-09-24 NOTE — Addendum Note (Signed)
Addended by: Elvin So L on: 09/24/2020 11:10 AM   Modules accepted: Orders
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
## 2020-09-25 ENCOUNTER — Other Ambulatory Visit: Payer: Self-pay

## 2020-09-25 DIAGNOSIS — M5410 Radiculopathy, site unspecified: Secondary | ICD-10-CM

## 2020-09-25 DIAGNOSIS — R600 Localized edema: Secondary | ICD-10-CM

## 2020-09-28 DIAGNOSIS — M79669 Pain in unspecified lower leg: Secondary | ICD-10-CM | POA: Insufficient documentation

## 2020-09-28 DIAGNOSIS — M47816 Spondylosis without myelopathy or radiculopathy, lumbar region: Secondary | ICD-10-CM | POA: Insufficient documentation

## 2020-09-28 DIAGNOSIS — M7989 Other specified soft tissue disorders: Secondary | ICD-10-CM | POA: Insufficient documentation

## 2020-09-28 DIAGNOSIS — J45909 Unspecified asthma, uncomplicated: Secondary | ICD-10-CM | POA: Insufficient documentation
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
## 2020-09-29 ENCOUNTER — Ambulatory Visit (INDEPENDENT_AMBULATORY_CARE_PROVIDER_SITE_OTHER): Payer: BC Managed Care – PPO | Admitting: Vascular Surgery

## 2020-09-29 ENCOUNTER — Other Ambulatory Visit: Payer: Self-pay

## 2020-09-29 VITALS — BP 114/74 | HR 89 | Ht 65.0 in | Wt 121.0 lb

## 2020-09-29 DIAGNOSIS — R102 Pelvic and perineal pain: Secondary | ICD-10-CM

## 2020-09-29 DIAGNOSIS — M4726 Other spondylosis with radiculopathy, lumbar region: Secondary | ICD-10-CM | POA: Diagnosis not present

## 2020-09-29 DIAGNOSIS — J45909 Unspecified asthma, uncomplicated: Secondary | ICD-10-CM | POA: Diagnosis not present

## 2020-09-29 DIAGNOSIS — M7989 Other specified soft tissue disorders: Secondary | ICD-10-CM

## 2020-09-29 DIAGNOSIS — L710 Perioral dermatitis: Secondary | ICD-10-CM | POA: Insufficient documentation

## 2020-09-29 DIAGNOSIS — G8929 Other chronic pain: Secondary | ICD-10-CM

## 2020-09-29 DIAGNOSIS — M79669 Pain in unspecified lower leg: Secondary | ICD-10-CM

## 2020-09-30 ENCOUNTER — Emergency Department (HOSPITAL_COMMUNITY): Payer: BC Managed Care – PPO

## 2020-09-30 ENCOUNTER — Emergency Department (HOSPITAL_COMMUNITY)
Admission: EM | Admit: 2020-09-30 | Discharge: 2020-09-30 | Disposition: A | Discharge status: Home / Self Care | Payer: BC Managed Care – PPO | Attending: Emergency Medicine | Admitting: Emergency Medicine

## 2020-09-30 DIAGNOSIS — R102 Pelvic and perineal pain: Secondary | ICD-10-CM | POA: Diagnosis not present

## 2020-09-30 DIAGNOSIS — R0789 Other chest pain: Secondary | ICD-10-CM | POA: Insufficient documentation

## 2020-09-30 DIAGNOSIS — R42 Dizziness and giddiness: Secondary | ICD-10-CM | POA: Insufficient documentation

## 2020-09-30 DIAGNOSIS — Z5321 Procedure and treatment not carried out due to patient leaving prior to being seen by health care provider: Secondary | ICD-10-CM | POA: Diagnosis not present

## 2020-09-30 DIAGNOSIS — M25511 Pain in right shoulder: Secondary | ICD-10-CM | POA: Insufficient documentation

## 2020-09-30 LAB — CBC WITH DIFFERENTIAL/PLATELET
Abs Immature Granulocytes: 0.01 10*3/uL (ref 0.00–0.07)
Basophils Absolute: 0 10*3/uL (ref 0.0–0.1)
Basophils Relative: 0 %
Eosinophils Absolute: 0.1 10*3/uL (ref 0.0–0.5)
Eosinophils Relative: 1 %
HCT: 43 % (ref 36.0–46.0)
Hemoglobin: 14.2 g/dL (ref 12.0–15.0)
Immature Granulocytes: 0 %
Lymphocytes Relative: 49 %
Lymphs Abs: 2.2 10*3/uL (ref 0.7–4.0)
MCH: 29.8 pg (ref 26.0–34.0)
MCHC: 33 g/dL (ref 30.0–36.0)
MCV: 90.3 fL (ref 80.0–100.0)
Monocytes Absolute: 0.4 10*3/uL (ref 0.1–1.0)
Monocytes Relative: 8 %
Neutro Abs: 1.9 10*3/uL (ref 1.7–7.7)
Neutrophils Relative %: 42 %
Platelets: 265 10*3/uL (ref 150–400)
RBC: 4.76 MIL/uL (ref 3.87–5.11)
RDW: 12.9 % (ref 11.5–15.5)
WBC: 4.5 10*3/uL (ref 4.0–10.5)
nRBC: 0 % (ref 0.0–0.2)

## 2020-09-30 LAB — TROPONIN I (HIGH SENSITIVITY)
Troponin I (High Sensitivity): 2 ng/L (ref <18)
Troponin I (High Sensitivity): <2 ng/L (ref <18)

## 2020-09-30 LAB — COMPREHENSIVE METABOLIC PANEL
ALT: 18 U/L (ref 0–44)
AST: 21 U/L (ref 15–41)
Albumin: 4.5 g/dL (ref 3.5–5.0)
Alkaline Phosphatase: 49 U/L (ref 38–126)
Anion gap: 13 (ref 5–15)
BUN: 12 mg/dL (ref 6–20)
CO2: 24 mmol/L (ref 22–32)
Calcium: 9.6 mg/dL (ref 8.9–10.3)
Chloride: 101 mmol/L (ref 98–111)
Creatinine, Ser: 0.7 mg/dL (ref 0.44–1.00)
GFR, Estimated: >60 mL/min (ref >60)
Glucose, Bld: 98 mg/dL (ref 70–99)
Potassium: 3.9 mmol/L (ref 3.5–5.1)
Sodium: 138 mmol/L (ref 135–145)
Total Bilirubin: 1.3 mg/dL — ABNORMAL HIGH (ref 0.3–1.2)
Total Protein: 7.1 g/dL (ref 6.5–8.1)

## 2020-09-30 LAB — PROTIME-INR
INR: 1 (ref 0.8–1.2)
Prothrombin Time: 13 seconds (ref 11.4–15.2)

## 2020-09-30 NOTE — ED Provider Notes (Signed)
Emergency Medicine Provider Triage Evaluation Note  Olivia Shaw , Shaw 50 y.o. female  was evaluated in triage.  Pt complains of multiple complaints.  Yesterday had some numbness to her bilateral upper and lower extremities, resolved.  Went to bed fine yesterday.  Woke up this morning and felt lightheaded and dizzy.  Feels like her right shoulder is "pressed" up against her body.  Feels generally weak.  Feels like she was unable to pick her head up off the bed due to weakness.  She has had Shaw tight sensation across her chest into her bilateral neck which has been chronic since she woke up.  No history of heart problems.  Was seen by vascular yesterday for possible pelvic congestion syndrome due to chronic pelvic pain.  She is also had some chronic left lower extremity pain which she has been seen by Ortho for.  Review of Systems  Positive: Headache, dizziness, lightheadedness, weakness, chest pain Negative: Fever, emesis  Physical Exam  BP 113/74 (BP Location: Right Arm)   Pulse 72   Temp 98.4 F (36.9 C) (Oral)   Resp 16   Ht 5\' 5"  (1.651 m)   Wt 55.3 kg   SpO2 100%   BMI 20.30 kg/m  Gen:   Awake, no distress   Resp:  Normal effort  MSK:   Moves extremities without difficulty  Neuro:  CN 2-12 intact, equal strength, intact sensation Other:    Medical Decision Making  Medically screening exam initiated at 10:01 AM.  Appropriate orders placed.  Olivia Shaw was informed that the remainder of the evaluation will be completed by another provider, this initial triage assessment does not replace that evaluation, and the importance of remaining in the ED until their evaluation is complete.  Chest pain, weakness, numbness  Patient is not Shaw code stroke, does not meet LVO criteria, nonfocal exam.  No back pain to suggest dissection.  Will order labs and imaging.   Olivia Mezo A, PA-C 09/30/20 1003    Olivia Hidden, MD 09/30/20 [phone]

## 2020-09-30 NOTE — ED Notes (Signed)
Pt says she has an appt tomorrow and would like to leave at this time. Pt left following this conversation.

## 2020-09-30 NOTE — ED Triage Notes (Signed)
Pt arrived by EMS c/o dizziness, heaviness and chest pressure.  Pt states she laid down and was unable to lift her head because it felt heavy.   Pt states she now feels much more clear but is having right shoulder pain still  Pt was told she has pelvic congestion

## 2020-10-01 ENCOUNTER — Encounter (INDEPENDENT_AMBULATORY_CARE_PROVIDER_SITE_OTHER): Payer: Self-pay | Admitting: Vascular Surgery

## 2020-10-01 ENCOUNTER — Encounter: Payer: Self-pay | Admitting: Neurology

## 2020-10-01 ENCOUNTER — Ambulatory Visit: Payer: BC Managed Care – PPO | Admitting: Neurology

## 2020-10-01 ENCOUNTER — Telehealth: Payer: Self-pay | Admitting: General Practice

## 2020-10-01 VITALS — BP 120/79 | HR 77 | Ht 65.0 in | Wt 126.0 lb

## 2020-10-01 DIAGNOSIS — R202 Paresthesia of skin: Secondary | ICD-10-CM
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
## 2020-10-01 NOTE — Telephone Encounter (Signed)
Transition Care Management Follow-up Telephone Call Date of discharge and from where: 09/30/20 from [facility] How have you been since you were released from the hospital? She had a CT this morning and it showed pelvic congestion. She is following up with vascular team. Any questions or concerns? No  Items Reviewed: Did the pt receive and understand the discharge instructions provided? Yes  Medications obtained and verified? No  Other? No  Any new allergies since your discharge? No  Dietary orders reviewed? Yes Do you have support at home? Yes   Home Care and Equipment/Supplies: Were home health services ordered? no  Functional Questionnaire: (I = Independent and D = Dependent) ADLs: I  Bathing/Dressing- I  Meal Prep- I  Eating- I  Maintaining continence- I  Transferring/Ambulation- I  Managing Meds- I  Follow up appointments reviewed:  PCP Hospital f/u appt confirmed? No  Patient is following up with vascular and ob/gyn. [facility] f/u appt confirmed? No   Are transportation arrangements needed? No  If their condition worsens, is the pt aware to call PCP or go to the Emergency Dept.? Yes Was the patient provided with contact information for the PCP's office or ED? Yes Was to pt encouraged to call back with questions or concerns? Yes

## 2020-10-08 ENCOUNTER — Telehealth (INDEPENDENT_AMBULATORY_CARE_PROVIDER_SITE_OTHER): Payer: Self-pay

## 2020-10-08 NOTE — Telephone Encounter (Signed)
Left a message on patient voicemail to return call 

## 2020-10-08 NOTE — Telephone Encounter (Signed)
That is fine, she can contact us if things change

## 2020-10-09 NOTE — Telephone Encounter (Signed)
Patient will call back to reschedule appt

## 2020-10-13 ENCOUNTER — Encounter (INDEPENDENT_AMBULATORY_CARE_PROVIDER_SITE_OTHER): Payer: Self-pay

## 2020-10-13 ENCOUNTER — Encounter (INDEPENDENT_AMBULATORY_CARE_PROVIDER_SITE_OTHER): Payer: BC Managed Care – PPO

## 2020-10-13 ENCOUNTER — Ambulatory Visit (INDEPENDENT_AMBULATORY_CARE_PROVIDER_SITE_OTHER): Payer: BC Managed Care – PPO | Admitting: Vascular Surgery

## 2020-11-20 ENCOUNTER — Ambulatory Visit: Payer: BC Managed Care – PPO | Admitting: Sports Medicine

## 2020-11-20 ENCOUNTER — Other Ambulatory Visit: Payer: Self-pay

## 2020-11-20 DIAGNOSIS — M2040 Other hammer toe(s) (acquired), unspecified foot: Secondary | ICD-10-CM

## 2020-11-20 DIAGNOSIS — M21619 Bunion of unspecified foot: Secondary | ICD-10-CM | POA: Diagnosis not present

## 2020-11-20 DIAGNOSIS — M779 Enthesopathy, unspecified: Secondary | ICD-10-CM | POA: Diagnosis not present

## 2020-11-20 DIAGNOSIS — M79675 Pain in left toe(s): Secondary | ICD-10-CM

## 2020-11-20 DIAGNOSIS — M7741 Metatarsalgia, right foot: Secondary | ICD-10-CM | POA: Diagnosis not present

## 2020-11-20 DIAGNOSIS — M7742 Metatarsalgia, left foot: Secondary | ICD-10-CM

## 2020-11-20 NOTE — Progress Notes (Signed)
Subjective: Olivia Shaw is a 50 y.o. female patient who returns to office for follow-up evaluation of left greater than right foot pain over the area of bunion and at her second toe joint plantarly/ball of foot.  Patient reports that things are about the same and has finally decided to do surgery wants to further discuss.  Patient denies any other pedal complaints or changes with medical history since last encounter.  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   Chronic interstitial cystitis without hematuria 12/03/2015   Chronic LLQ pain 09/29/2015   Constipation by delayed colonic transit 09/29/2015   Increased frequency of urination 09/29/2015   Muscle spasm 09/29/2015   Chronic pelvic pain in female 09/25/2015   Endometriosis of pelvis 09/25/2015   Dense breasts 03/23/2015   Liver cyst 03/23/2015   Mild intermittent asthma 03/23/2015   Murmur 03/23/2015   Anemia 02/01/2015   Minor opacity of both corneas 08/04/2014    Current Outpatient Medications on File Prior to Visit  Medication Sig Dispense Refill   Albuterol Sulfate (PROAIR RESPICLICK) 510 (90 Base) MCG/ACT AEPB Inhale into the lungs.     cetirizine (ZYRTEC) 10 MG tablet Take by mouth.     diazepam (VALIUM) 5 MG tablet Take 5 mg by mouth every 12 (twelve) hours as needed.  5   estradiol (VIVELLE-DOT) 0.025 MG/24HR Place 1 patch onto the skin 2 (two) times a week.     ibuprofen (ADVIL) 200 MG tablet Take 200 mg by mouth every 6 (six) hours as needed.      rizatriptan (MAXALT-MLT) 5 MG disintegrating tablet Take 1 tablet (5 mg total) by mouth as needed for migraine. May repeat in 2 hours if needed 10 tablet 1   traMADol (ULTRAM) 50 MG tablet Take 50 mg by mouth every 6 (six) hours as needed.  5   triamcinolone cream (KENALOG) 0.1 % APPLY TO AFFECTED AREA TWICE A DAY FOR 2 WEEKS     No current facility-administered medications on file prior to visit.    Allergies  Allergen Reactions   Kiwi Extract Anaphylaxis   Latex Rash   Levaquin [Levofloxacin] Hives   Other Rash   Sulfa Antibiotics Itching   Doxycycline Palpitations    Objective:  General: Alert and oriented x3 in no acute distress  Dermatology: No open lesions bilateral lower extremities, no webspace macerations, no ecchymosis bilateral, all nails x 10 are well manicured.  Minimal reactive keratosis medial first toes bilateral.  Vascular: Dorsalis Pedis and Posterior Tibial pedal pulses 2/4, Capillary Fill Time 3 seconds, (+) pedal hair growth bilateral, no edema bilateral lower extremities, Temperature gradient within normal limits.  Neurology: Johney Maine sensation intact via light touch bilateral.  Musculoskeletal: Continued tenderness to forefoot at 2-3 toes left>right, Mild tenderness with palpation left>right bunion deformity, no limitation or crepitus with range of motion, deformity reducible, tracking not trackbound, there is mild 1st ray hypermobility noted bilateral.  Midtarsal, Subtalar joint, and ankle joint range of motion is within normal limits. On weightbearing exam, there is  decreased 1st MTPJ rom Left >right with functional limitus noted, 35DF, 10 PF, there is medial arch collapse Left >right on weightbearing, rearfoot slight valgus, forefoot slight abduction with HAV deformity supported on ground with no second toe crossover deformity noted but there is impingement and hammertoe deformity starting especially at the second toes left greater than right.  Exam unchanged from  prior.   Xrays  Right/Left Foot    Impression: Intermetatarsal angle above normal limits supportive of bunion with 1st ray elevatus.  There is also flexion contracture of the lesser toes worse at the second toe likely from hallux impingement greater on the left compared to right foot.  Unchanged from prior.      Assessment and Plan: Problem List Items Addressed This Visit   None Visit Diagnoses     Bunion    -  Primary   Hammer toe, unspecified laterality       Metatarsalgia of both feet       Capsulitis       Pain of toe of left foot           -Complete examination performed -Xrays reviewed -Discussed treatement options; discussed HAV and hammertoe deformity with toe pain/hammertoe with ankle sprain;conservative and  Surgical management; risks, benefits, alternatives discussed. All patient's questions answered. --Patient opt for surgical management. Consent obtained for Lapidus procedure with possible first toe osteotomy, second hammertoe repair with second metatarsal osteotomy. Pre and Post op course explained. Risks, benefits, alternatives explained. No guarantees given or implied. Surgical booking slip submitted and provided patient with Surgical packet and info for [place] -To dispense cam boot and crutches at surgical center -Patient will borrow from a friend and knee scooter -Patient to return to office after surgery or sooner if condition worsens.  Landis Martins, DPM

## 2020-11-28 ENCOUNTER — Telehealth: Payer: Self-pay | Admitting: Urology

## 2020-11-28 NOTE — Telephone Encounter (Signed)
DOS - 12/22/20  AIKEN OSTEOTOMY LEFT --- 93968 LAPIDUS PROC INC. BUNIONECTOMY LEFT --- [id number] METATARSAL OSTEOTOMY 2ND LEFT --- 72072 HAMMERTOE REPAIR 2ND LEFT --- [id number]   [employer] EFFECTIVE DATE - 01/12/20  PLAN DEDUCTIBLE - $1,250.00 W/ $0.00 REMAINING OUT OF POCKET - $4,890.00 W/ $936.16 REMAINING COINSURANCE - 20%  COPAY - $0.00   NO PRIOR AUTH IS REQUIRED

## 2020-12-12 ENCOUNTER — Encounter: Payer: Self-pay | Admitting: Plastic Surgery

## 2020-12-12 ENCOUNTER — Other Ambulatory Visit: Payer: Self-pay

## 2020-12-12 ENCOUNTER — Ambulatory Visit: Payer: BC Managed Care – PPO | Admitting: Plastic Surgery

## 2020-12-12 VITALS — BP 111/76 | HR 70 | Ht 65.0 in | Wt 125.6 lb

## 2020-12-12 DIAGNOSIS — D489 Neoplasm of uncertain behavior, unspecified: Secondary | ICD-10-CM
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
## 2020-12-17 ENCOUNTER — Encounter: Payer: BC Managed Care – PPO | Admitting: Neurology

## 2020-12-21 ENCOUNTER — Other Ambulatory Visit: Payer: Self-pay | Admitting: Sports Medicine

## 2020-12-21 DIAGNOSIS — Z9889 Other specified postprocedural states: Secondary | ICD-10-CM

## 2020-12-21 MED ORDER — OXYCODONE-ACETAMINOPHEN 10-325 MG PO TABS
1.0000 | ORAL_TABLET | Freq: Four times a day (QID) | ORAL | 0 refills | Status: AC | PRN
Start: 2020-12-22 — End: 2020-12-29

## 2020-12-21 MED ORDER — IBUPROFEN 800 MG PO TABS: 800.0000 mg | ORAL_TABLET | Freq: Three times a day (TID) | ORAL | 0 refills | Status: DC | PRN

## 2020-12-21 MED ORDER — DOCUSATE SODIUM 100 MG PO CAPS: 100.0000 mg | ORAL_CAPSULE | Freq: Two times a day (BID) | ORAL | 0 refills | Status: DC

## 2020-12-21 MED ORDER — PROMETHAZINE HCL 12.5 MG PO TABS: 12.5000 mg | ORAL_TABLET | Freq: Three times a day (TID) | ORAL | 0 refills | Status: DC | PRN

## 2020-12-21 NOTE — Progress Notes (Signed)
Post meds entered

## 2020-12-22 ENCOUNTER — Encounter: Payer: Self-pay | Admitting: Sports Medicine

## 2020-12-22 DIAGNOSIS — M2012 Hallux valgus (acquired), left foot: Secondary | ICD-10-CM | POA: Diagnosis not present

## 2020-12-22 DIAGNOSIS — M2042 Other hammer toe(s) (acquired), left foot: Secondary | ICD-10-CM

## 2020-12-22 DIAGNOSIS — M21542 Acquired clubfoot, left foot: Secondary | ICD-10-CM

## 2020-12-23 ENCOUNTER — Telehealth: Payer: Self-pay | Admitting: Sports Medicine

## 2020-12-23 NOTE — Telephone Encounter (Signed)
Post op check phone call made to patient.  Patient did not answer her phone call went directly to voicemail.  Left a voicemail for patient to call office if she has any postoperative questions or concerns.  Callback number was provided.  Dr. Cannon Kettle

## 2020-12-26 ENCOUNTER — Telehealth: Payer: Self-pay | Admitting: *Deleted

## 2020-12-26 NOTE — Telephone Encounter (Signed)
Patient is calling because she is having a lot of anterior tibial swelling two thirds up to the knee, first noticed and more painful today. She has been elevating but  is this normal? Please advise.

## 2020-12-27 ENCOUNTER — Encounter: Payer: Self-pay | Admitting: Sports Medicine

## 2020-12-29 ENCOUNTER — Ambulatory Visit (HOSPITAL_BASED_OUTPATIENT_CLINIC_OR_DEPARTMENT_OTHER)
Admission: RE | Admit: 2020-12-29 | Discharge: 2020-12-29 | Disposition: A | Discharge status: Home / Self Care | Payer: BC Managed Care – PPO | Source: Ambulatory Visit | Attending: Sports Medicine | Admitting: Sports Medicine

## 2020-12-29 ENCOUNTER — Telehealth: Payer: Self-pay | Admitting: *Deleted

## 2020-12-29 ENCOUNTER — Encounter: Payer: Self-pay | Admitting: Sports Medicine

## 2020-12-29 ENCOUNTER — Other Ambulatory Visit: Payer: Self-pay | Admitting: *Deleted

## 2020-12-29 ENCOUNTER — Other Ambulatory Visit: Payer: Self-pay

## 2020-12-29 ENCOUNTER — Other Ambulatory Visit: Payer: Self-pay | Admitting: Sports Medicine

## 2020-12-29 DIAGNOSIS — M7989 Other specified soft tissue disorders: Secondary | ICD-10-CM

## 2020-12-29 NOTE — Progress Notes (Signed)
[place] ordered for [place]anda

## 2020-12-29 NOTE — Telephone Encounter (Signed)
Patient has been scheduled for [facility], today @1 :00,arrival time is 12:45,patient has been notified and given phone if she needs to reschedule the appointment.

## 2020-12-29 NOTE — Telephone Encounter (Signed)
Called and gave recommendations per Dr Cannon Kettle, verbalized understanding,is requesting a refill of the percocet, so that she does not run out over holidays. Please advise.

## 2020-12-30 ENCOUNTER — Other Ambulatory Visit: Payer: Self-pay | Admitting: Sports Medicine

## 2020-12-30 MED ORDER — OXYCODONE-ACETAMINOPHEN 10-325 MG PO TABS: 1.0000 | ORAL_TABLET | Freq: Four times a day (QID) | ORAL | 0 refills | Status: AC | PRN

## 2020-12-30 NOTE — Progress Notes (Signed)
Pain medication refilled

## 2020-12-30 NOTE — Telephone Encounter (Signed)
Patient notified

## 2021-01-01 ENCOUNTER — Ambulatory Visit (INDEPENDENT_AMBULATORY_CARE_PROVIDER_SITE_OTHER): Payer: BC Managed Care – PPO | Admitting: Sports Medicine

## 2021-01-01 ENCOUNTER — Telehealth: Payer: Self-pay | Admitting: Sports Medicine

## 2021-01-01 ENCOUNTER — Ambulatory Visit (INDEPENDENT_AMBULATORY_CARE_PROVIDER_SITE_OTHER): Payer: BC Managed Care – PPO

## 2021-01-01 ENCOUNTER — Other Ambulatory Visit: Payer: Self-pay | Admitting: Sports Medicine

## 2021-01-01 ENCOUNTER — Other Ambulatory Visit: Payer: Self-pay

## 2021-01-01 DIAGNOSIS — Z9889 Other specified postprocedural states: Secondary | ICD-10-CM

## 2021-01-01 DIAGNOSIS — M2040 Other hammer toe(s) (acquired), unspecified foot: Secondary | ICD-10-CM

## 2021-01-01 DIAGNOSIS — M7989 Other specified soft tissue disorders: Secondary | ICD-10-CM

## 2021-01-01 DIAGNOSIS — M2042 Other hammer toe(s) (acquired), left foot: Secondary | ICD-10-CM

## 2021-01-01 DIAGNOSIS — M21619 Bunion of unspecified foot: Secondary | ICD-10-CM

## 2021-01-01 DIAGNOSIS — R58 Hemorrhage, not elsewhere classified: Secondary | ICD-10-CM

## 2021-01-01 NOTE — Progress Notes (Signed)
Subjective: Olivia Shaw is a 50 y.o. female patient seen today in office for POV #1 (DOS 12/22/2020), S/P left Lapidus bunionectomy second hammertoe and second metatarsal osteotomy. Patient admits some pain at the pin at the second toe and states that her swelling has gotten a little bit better now that she is using an Ace wrap all the way up to her knee.  No other issues noted.   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     diazepam (VALIUM) 5 MG tablet Take 5 mg by mouth every 12 (twelve) hours as needed.  5   docusate sodium (COLACE) 100 MG capsule Take 1 capsule (100 mg total) by mouth 2 (two) times daily. 10 capsule 0   estradiol (VIVELLE-DOT) 0.025 MG/24HR Place 1 patch onto the skin 2 (two) times a week.     ibuprofen (ADVIL) 800 MG tablet Take 1 tablet (800 mg total) by mouth every 8 (eight) hours as needed for mild  pain. 30 tablet 0   oxyCODONE-acetaminophen (PERCOCET) 10-325 MG tablet Take 1 tablet by mouth every 6 (six) hours as needed for up to 7 days for pain. 28 tablet 0   promethazine (PHENERGAN) 12.5 MG tablet Take 1 tablet (12.5 mg total) by mouth every 8 (eight) hours as needed for nausea or vomiting. 20 tablet 0   rizatriptan (MAXALT-MLT) 5 MG disintegrating tablet Take 1 tablet (5 mg total) by mouth as needed for migraine. May repeat in 2 hours if needed 10 tablet 1   traMADol (ULTRAM) 50 MG tablet Take 50 mg by mouth every 6 (six) hours as needed.  5   triamcinolone cream (KENALOG) 0.1 % APPLY TO AFFECTED AREA TWICE A DAY FOR 2 WEEKS     No current facility-administered medications on file prior to visit.    Allergies  Allergen Reactions   Kiwi Extract Anaphylaxis   Latex Rash   Levaquin [Levofloxacin] Hives   Other Rash   Sulfa Antibiotics Itching   Doxycycline Palpitations    Objective: There were no vitals filed for this visit.  General: No acute distress, AAOx3  Left foot: K wire at second toe and sutures intact with no gapping or dehiscence at surgical sites, moderate swelling to the foot with ecchymosis capillary fill time <3 seconds in all digits, gross sensation present via light touch to left foot.   Post Op Xray, left foot osteotomy site healing.  Hardware intact. Soft tissue swelling within normal limits for post op status.   Assessment and Plan:  Problem List Items Addressed This Visit   None Visit Diagnoses     Bunion    -  Primary   Relevant Orders   DG Foot Complete Left   Swelling of left lower extremity       S/P foot surgery, left       Ecchymosis       Hammer toe, unspecified laterality            -Patient seen and evaluated -X-rays reviewed -Applied dry sterile dressing to surgical site left foot secured with ACE wrap and stockinet  -Advised patient to make sure to keep dressings clean, dry, and intact to left foot but may adjust the Ace wrap as  needed -Advised patient to continue with nonweightbearing with use of crutches and Aircast/splint dressing states she cannot tolerate the cam boot right now -Advised patient to stay off of the foot because anytime she has her foot down too long it can swell and cause more bruising or pain -Continue with pain medicine as previously prescribed -Will plan for suture removal and continuing nonweightbearing at next office visit. In the meantime, patient to call office if any issues or problems arise.   Landis Martins, DPM

## 2021-01-01 NOTE — Telephone Encounter (Signed)
Patient is requesting a handicap placard. She forgot to ask for it when she was at the office . Please advise

## 2021-01-08 ENCOUNTER — Ambulatory Visit (INDEPENDENT_AMBULATORY_CARE_PROVIDER_SITE_OTHER): Payer: BC Managed Care – PPO | Admitting: Podiatry

## 2021-01-08 ENCOUNTER — Other Ambulatory Visit: Payer: Self-pay

## 2021-01-08 DIAGNOSIS — M21619 Bunion of unspecified foot: Secondary | ICD-10-CM

## 2021-01-08 DIAGNOSIS — M2042 Other hammer toe(s) (acquired), left foot: Secondary | ICD-10-CM

## 2021-01-12 NOTE — Progress Notes (Signed)
°  Subjective:  Patient ID: Olivia Shaw, female    DOB: 1970/05/18,  MRN: [id number]  Chief Complaint  Patient presents with   Routine Post Op     POV #2 DOS 12/22/2020 1ST TOE OSTEOTOMY W/BUNIOECTOMT, 2ND HAMMERTOE REPAIR & POSS 2ND METATARASAL OSTEOTOMY, LEFT FOOT/DR [facility] PT    51 y.o. female returns for post-op check.  She is here today for suture removal her pain is improving and has been elevating the foot.  Review of Systems: Negative except as noted in the HPI. Denies N/V/F/Ch.   Objective:  There were no vitals filed for this visit. There is no height or weight on file to calculate BMI. Constitutional Well developed. Well nourished.  Vascular Foot warm and well perfused. Capillary refill normal to all digits.  Calf is soft and supple, no posterior calf or knee pain, negative Homans' sign  Neurologic Normal speech. Oriented to person, place, and time. Epicritic sensation to light touch grossly present bilaterally.  Dermatologic Skin healing well without signs of infection. Skin edges well coapted without signs of infection.  Orthopedic: Tenderness to palpation noted about the surgical site.    Assessment:   1. Bunion   2. Hammertoe of left foot    Plan:  Patient was evaluated and treated and all questions answered.  S/p foot surgery left -Progressing as expected post-operatively. -Sutures removed uneventfully.  She may begin bathing.  Steri-Strips applied. -Continue WBAT in the CAM boot.  Return in about 2 weeks (around 01/22/2021).

## 2021-01-15 ENCOUNTER — Ambulatory Visit (INDEPENDENT_AMBULATORY_CARE_PROVIDER_SITE_OTHER): Payer: BC Managed Care – PPO | Admitting: Sports Medicine

## 2021-01-15 ENCOUNTER — Ambulatory Visit (INDEPENDENT_AMBULATORY_CARE_PROVIDER_SITE_OTHER): Payer: BC Managed Care – PPO

## 2021-01-15 ENCOUNTER — Other Ambulatory Visit: Payer: Self-pay

## 2021-01-15 ENCOUNTER — Encounter: Payer: Self-pay | Admitting: Sports Medicine

## 2021-01-15 DIAGNOSIS — M779 Enthesopathy, unspecified: Secondary | ICD-10-CM

## 2021-01-15 DIAGNOSIS — M2042 Other hammer toe(s) (acquired), left foot: Secondary | ICD-10-CM

## 2021-01-15 DIAGNOSIS — S93402A Sprain of unspecified ligament of left ankle, initial encounter: Secondary | ICD-10-CM

## 2021-01-15 DIAGNOSIS — M7989 Other specified soft tissue disorders: Secondary | ICD-10-CM

## 2021-01-15 DIAGNOSIS — M21619 Bunion of unspecified foot: Secondary | ICD-10-CM

## 2021-01-15 DIAGNOSIS — Z9889 Other specified postprocedural states: Secondary | ICD-10-CM

## 2021-01-15 DIAGNOSIS — M792 Neuralgia and neuritis, unspecified: Secondary | ICD-10-CM

## 2021-01-15 MED ORDER — OXYCODONE-ACETAMINOPHEN 10-325 MG PO TABS
1.0000 | ORAL_TABLET | Freq: Four times a day (QID) | ORAL | 0 refills | Status: AC | PRN
Start: 2021-01-15 — End: 2021-01-22
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
## 2021-01-22 ENCOUNTER — Encounter: Payer: Self-pay | Admitting: Sports Medicine

## 2021-01-22 ENCOUNTER — Ambulatory Visit (INDEPENDENT_AMBULATORY_CARE_PROVIDER_SITE_OTHER): Payer: BC Managed Care – PPO

## 2021-01-22 ENCOUNTER — Other Ambulatory Visit: Payer: Self-pay

## 2021-01-22 ENCOUNTER — Ambulatory Visit (INDEPENDENT_AMBULATORY_CARE_PROVIDER_SITE_OTHER): Payer: BC Managed Care – PPO | Admitting: Sports Medicine

## 2021-01-22 DIAGNOSIS — M2042 Other hammer toe(s) (acquired), left foot: Secondary | ICD-10-CM | POA: Diagnosis not present

## 2021-01-22 DIAGNOSIS — M779 Enthesopathy, unspecified: Secondary | ICD-10-CM

## 2021-01-22 DIAGNOSIS — M21619 Bunion of unspecified foot: Secondary | ICD-10-CM

## 2021-01-22 DIAGNOSIS — M792 Neuralgia and neuritis, unspecified: Secondary | ICD-10-CM

## 2021-01-22 DIAGNOSIS — S93402A Sprain of unspecified ligament of left ankle, initial encounter: Secondary | ICD-10-CM

## 2021-01-22 DIAGNOSIS — M7989 Other specified soft tissue disorders: Secondary | ICD-10-CM

## 2021-01-22 DIAGNOSIS — Z9889 Other specified postprocedural states: Secondary | ICD-10-CM

## 2021-01-22 NOTE — Progress Notes (Signed)
Subjective: Olivia Shaw is a 51 y.o. female patient seen today in office for POV #4 (DOS 12/22/2020), S/P left Lapidus bunionectomy second hammertoe and second metatarsal osteotomy. Patient admits to swelling and states that she still gets occasional sharp pains but the lidocaine topical is helping.  Patient is assisted by family/significant other this visit.  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     diazepam (VALIUM) 5 MG tablet Take 5 mg by mouth every 12 (twelve) hours as needed.  5   docusate sodium (COLACE) 100 MG capsule Take 1 capsule (100 mg total) by mouth 2 (two) times daily. 10 capsule 0   estradiol (VIVELLE-DOT) 0.025 MG/24HR Place 1 patch onto the skin 2 (two) times a week.     ibuprofen (ADVIL) 800 MG tablet Take 1 tablet (800 mg total) by mouth every 8 (eight) hours as needed for mild pain. 30 tablet 0    oxyCODONE-acetaminophen (PERCOCET) 10-325 MG tablet Take 1 tablet by mouth every 6 (six) hours as needed for up to 7 days for pain. 28 tablet 0   promethazine (PHENERGAN) 12.5 MG tablet Take 1 tablet (12.5 mg total) by mouth every 8 (eight) hours as needed for nausea or vomiting. 20 tablet 0   rizatriptan (MAXALT-MLT) 5 MG disintegrating tablet Take 1 tablet (5 mg total) by mouth as needed for migraine. May repeat in 2 hours if needed 10 tablet 1   traMADol (ULTRAM) 50 MG tablet Take 50 mg by mouth every 6 (six) hours as needed.  5   triamcinolone cream (KENALOG) 0.1 % APPLY TO AFFECTED AREA TWICE A DAY FOR 2 WEEKS     No current facility-administered medications on file prior to visit.    Allergies  Allergen Reactions   Kiwi Extract Anaphylaxis   Latex Rash   Levaquin [Levofloxacin] Hives   Other Rash   Sulfa Antibiotics Itching   Doxycycline Palpitations    Objective: There were no vitals filed for this visit.  General: No acute distress, AAOx3  Left foot: K wire at second toe and incisions intact with no gapping or dehiscence at surgical sites, decreased swelling to the left foot, resolved pain at the lateral foot and ankle, capillary fill time <3 seconds in all digits, gross sensation present via light touch to left foot.   Post Op Xray, left foot osteotomy site healing. Hardware intact. Soft tissue swelling within normal limits  for post op status.  No other acute osseous findings noted.  Assessment and Plan:  Problem List Items Addressed This Visit   None Visit Diagnoses     Hammertoe of left foot    -  Primary   Relevant Orders   DG Foot Complete Left   Sprain of left ankle, unspecified ligament, initial encounter       Tendinitis       Neuritis       Bunion       Swelling of left lower extremity       S/P foot surgery, left            -Patient seen and evaluated -X-rays reviewed -Kwire removed -Patient may shower and re-dress with with ACE wrap  -May touch  down weightbear to heel with wedge shoe as dispensed this visit -Continue with Percocet as needed for pain  -Encourage strict elevation and compression to help with edema control -Will plan for transition to full weightbearing at next office visit with cam boot depending on patient's amount of swelling. In the meantime, patient to call office if any issues or problems arise.   Landis Martins, DPM

## 2021-02-03 ENCOUNTER — Encounter: Payer: Self-pay | Admitting: Sports Medicine

## 2021-02-12 ENCOUNTER — Other Ambulatory Visit: Payer: Self-pay

## 2021-02-12 ENCOUNTER — Ambulatory Visit (INDEPENDENT_AMBULATORY_CARE_PROVIDER_SITE_OTHER): Payer: BC Managed Care – PPO | Admitting: Sports Medicine

## 2021-02-12 ENCOUNTER — Encounter: Payer: Self-pay | Admitting: Sports Medicine

## 2021-02-12 DIAGNOSIS — M2042 Other hammer toe(s) (acquired), left foot: Secondary | ICD-10-CM

## 2021-02-12 DIAGNOSIS — M21619 Bunion of unspecified foot: Secondary | ICD-10-CM

## 2021-02-12 DIAGNOSIS — M792 Neuralgia and neuritis, unspecified: Secondary | ICD-10-CM

## 2021-02-12 DIAGNOSIS — R609 Edema, unspecified: Secondary | ICD-10-CM

## 2021-02-12 DIAGNOSIS — Z9889 Other specified postprocedural states: Secondary | ICD-10-CM

## 2021-02-12 DIAGNOSIS — M79672 Pain in left foot: Secondary | ICD-10-CM

## 2021-02-12 NOTE — Progress Notes (Signed)
Subjective: Olivia Shaw is a 51 y.o. female patient seen today in office for POV # 5 (DOS 12/22/2020), S/P left Lapidus bunionectomy second hammertoe and second metatarsal osteotomy. 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  Patient admits awkward sensation over the surgical site but denies any other pedal complaints at this time.  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     diazepam (VALIUM) 5 MG tablet Take 5 mg by mouth every 12 (twelve) hours as needed.  5   docusate sodium (COLACE) 100 MG capsule Take 1 capsule (100 mg total) by mouth 2 (two) times daily. 10 capsule 0   estradiol (VIVELLE-DOT) 0.025 MG/24HR Place 1 patch  onto the skin 2 (two) times a week.     ibuprofen (ADVIL) 800 MG tablet Take 1 tablet (800 mg total) by mouth every 8 (eight) hours as needed for mild pain. 30 tablet 0   promethazine (PHENERGAN) 12.5 MG tablet Take 1 tablet (12.5 mg total) by mouth every 8 (eight) hours as needed for nausea or vomiting. 20 tablet 0   rizatriptan (MAXALT-MLT) 5 MG disintegrating tablet Take 1 tablet (5 mg total) by mouth as needed for migraine. May repeat in 2 hours if needed 10 tablet 1   traMADol (ULTRAM) 50 MG tablet Take 50 mg by mouth every 6 (six) hours as needed.  5   triamcinolone cream (KENALOG) 0.1 % APPLY TO AFFECTED AREA TWICE A DAY FOR 2 WEEKS     No current facility-administered medications on file prior to visit.    Allergies  Allergen Reactions   Kiwi Extract Anaphylaxis   Latex Rash   Levaquin [Levofloxacin] Hives   Other Rash   Sulfa Antibiotics Itching   Doxycycline Palpitations    Objective: There were no vitals filed for this visit.  General: No acute distress, AAOx3  Left foot: Surgical site incisions healing well, localized swelling to the left foot, soft tissue bruising noted to the plantar lateral forefoot and arch, there is burning pain and awkward sensation noted to surgical incision sites worse at the second toe, capillary fill time <3 seconds in all digits, gross sensation present via light touch to left foot with  sensitivity as noted above.   Assessment and Plan:  Problem List Items Addressed This Visit   None Visit Diagnoses     Hammertoe of left foot    -  Primary   Bunion       Swelling       Neuritis       S/P foot surgery, left       Left foot pain            -Patient seen and evaluated -Advised patient that she can transition as tolerated from weight shoe to flat surgical shoe with use of crutches as tolerated and may slowly wean from crutches as her pain or sensitivity improves -Encourage strict elevation and compression to help with edema control;  ordered CircAid compression garments from [facility] -Rx physical therapy at [facility] for pain edema reduction and gait training -Will plan for transition out of surgical shoe if swelling is improved and x-ray at next visit. In the meantime, patient to call office if any issues or problems arise.   Landis Martins, DPM

## 2021-02-26 DIAGNOSIS — K219 Gastro-esophageal reflux disease without esophagitis: Secondary | ICD-10-CM | POA: Insufficient documentation

## 2021-03-12 ENCOUNTER — Ambulatory Visit: Payer: BC Managed Care – PPO | Admitting: Sports Medicine

## 2021-03-12 ENCOUNTER — Other Ambulatory Visit: Payer: Self-pay

## 2021-03-12 ENCOUNTER — Encounter: Payer: Self-pay | Admitting: Sports Medicine

## 2021-03-12 ENCOUNTER — Ambulatory Visit (INDEPENDENT_AMBULATORY_CARE_PROVIDER_SITE_OTHER): Payer: BC Managed Care – PPO

## 2021-03-12 DIAGNOSIS — M21619 Bunion of unspecified foot: Secondary | ICD-10-CM

## 2021-03-12 DIAGNOSIS — M2042 Other hammer toe(s) (acquired), left foot: Secondary | ICD-10-CM

## 2021-03-12 DIAGNOSIS — M792 Neuralgia and neuritis, unspecified: Secondary | ICD-10-CM

## 2021-03-12 DIAGNOSIS — Z9889 Other specified postprocedural states: Secondary | ICD-10-CM

## 2021-03-12 DIAGNOSIS — M79672 Pain in left foot: Secondary | ICD-10-CM

## 2021-03-12 DIAGNOSIS — R609 Edema, unspecified: Secondary | ICD-10-CM

## 2021-03-12 NOTE — Progress Notes (Signed)
Subjective: ?Olivia Shaw is a 51 y.o. female patient seen today in office for POV # 6 (DOS 12/22/2020), S/P left Lapidus bunionectomy second hammertoe and second metatarsal osteotomy. Patient reports she is going to PT and making some improvements pain 2 out of 10 but still has some swelling and soreness across the top and at her lateral ankle.  Patient admits that she is now using a normal hiking keen shoe, denies any other pedal complaints at this time. ? ?Patient Active Problem List  ? Diagnosis Date Noted  ? Paresthesia 10/01/2020  ? Perioral dermatitis 09/29/2020  ? Pain and swelling of lower leg 09/28/2020  ? DJD (degenerative joint disease), lumbar 09/28/2020  ? Asthma, chronic 09/28/2020  ? Headache above the eye region 04/02/2020  ? History of sinusitis 04/02/2020  ? Lesion of ulnar nerve 03/31/2020  ? Nasal ulcer 01/09/2019  ? Vasomotor rhinitis 01/09/2019  ? Hallux valgus, acquired, bilateral 10/20/2018  ? Low back pain 10/06/2018  ? 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 Chronic interstitial cystitis without hematuria 12/03/2015  ? Chronic LLQ pain 09/29/2015  ? Constipation by delayed colonic transit 09/29/2015  ? Increased frequency of urination 09/29/2015  ? Muscle spasm 09/29/2015  ? Chronic pelvic pain in female 09/25/2015  ? Endometriosis of pelvis 09/25/2015  ? Dense breasts 03/23/2015  ? Liver cyst 03/23/2015  ? Mild intermittent asthma 03/23/2015  ? Murmur 03/23/2015  ? Anemia 02/01/2015  ? 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 ? ?Current Outpatient Medications on File Prior to Visit  ?Medication Sig Dispense Refill  ? cetirizine (ZYRTEC) 10 MG tablet Take by mouth.    ? diazepam (VALIUM) 5 MG tablet Take 5 mg by mouth every 12 (twelve) hours as needed.  5  ? docusate sodium (COLACE) 100 MG capsule Take 1 capsule (100 mg total) by mouth 2 (two) times daily. 10 capsule 0  ? estradiol (VIVELLE-DOT) 0.025 MG/24HR Place 1 patch onto the skin 2 (two) times a week.    ? ibuprofen (ADVIL) 800 MG tablet  Take 1 tablet (800 mg total) by mouth every 8 (eight) hours as needed for mild pain. 30 tablet 0  ? promethazine (PHENERGAN) 12.5 MG tablet Take 1 tablet (12.5 mg total) by mouth every 8 (eight) hours as needed for nausea or vomiting. 20 tablet 0  ? rizatriptan (MAXALT-MLT) 5 MG disintegrating tablet Take 1 tablet (5 mg total) by mouth as needed for migraine. May repeat in 2 hours if needed 10 tablet 1  ? traMADol (ULTRAM) 50 MG tablet Take 50 mg by mouth every 6 (six) hours as needed.  5  ? triamcinolone cream (KENALOG) 0.1 % APPLY TO AFFECTED AREA TWICE A DAY FOR 2 WEEKS    ? ?No current facility-administered medications on file prior to visit.  ? ? ?Allergies  ?Allergen Reactions  ? Kiwi Extract Anaphylaxis  ? Latex Rash  ? Levaquin [Levofloxacin] Hives  ? Other Rash  ? Sulfa Antibiotics Itching  ? Doxycycline Palpitations  ? ? ?Objective: ?There were no vitals filed for this visit. ? ?General: No acute distress, AAOx3  ?Left foot: Surgical site incisions well-healed, localized swelling to the left foot,  capillary fill time <3 seconds in all digits, gross sensation present via light touch, range of motion appears to be improving, there is mild elevation of the left second toe likely secondary to scar contracture, there is mild pain at the lateral ankle ligament and along the peroneal tendon course however range of motion appears to be  adequate with very minimal we will guarded at the ankle ? ?X-rays consistent with postop status hardware intact there is mild swelling at the first MPJ with slight hallux subluxation ? ?Assessment and Plan:  ?Problem List Items Addressed This Visit   ?None ?Visit Diagnoses   ? ? S/P foot surgery, left    -  Primary  ? Relevant Orders  ? DG Foot Complete Left  ? Hammertoe of left foot      ? Bunion      ? Swelling      ? Neuritis      ? Left foot pain      ? ?  ?  ? ?-Patient seen and evaluated ?-X-rays reviewed ?-Continue with physical therapy ?-Continue to tolerance use of stiff  soled shoe on left foot ?-Encouraged again strict elevation and compression to help with edema control ?-Return to office in 1 month for final postoperative check at next visit. In the meantime, patient to call office if any issues or problems arise.  ? ?Landis Martins, DPM  ?

## 2021-03-30 ENCOUNTER — Encounter: Payer: Self-pay | Admitting: Sports Medicine

## 2021-04-17 ENCOUNTER — Other Ambulatory Visit: Payer: Self-pay | Admitting: Internal Medicine

## 2021-04-17 DIAGNOSIS — Z1231 Encounter for screening mammogram for malignant neoplasm of breast: Secondary | ICD-10-CM

## 2021-04-23 ENCOUNTER — Ambulatory Visit (INDEPENDENT_AMBULATORY_CARE_PROVIDER_SITE_OTHER): Payer: BC Managed Care – PPO

## 2021-04-23 ENCOUNTER — Ambulatory Visit: Payer: BC Managed Care – PPO | Admitting: Sports Medicine

## 2021-04-23 DIAGNOSIS — G8929 Other chronic pain: Secondary | ICD-10-CM

## 2021-04-23 DIAGNOSIS — M79672 Pain in left foot: Secondary | ICD-10-CM

## 2021-04-23 DIAGNOSIS — Z9889 Other specified postprocedural states: Secondary | ICD-10-CM

## 2021-04-23 DIAGNOSIS — M2042 Other hammer toe(s) (acquired), left foot: Secondary | ICD-10-CM | POA: Diagnosis not present

## 2021-04-23 DIAGNOSIS — M25572 Pain in left ankle and joints of left foot: Secondary | ICD-10-CM | POA: Diagnosis not present

## 2021-04-23 DIAGNOSIS — M21619 Bunion of unspecified foot: Secondary | ICD-10-CM

## 2021-04-23 DIAGNOSIS — R609 Edema, unspecified: Secondary | ICD-10-CM

## 2021-04-23 NOTE — Progress Notes (Signed)
Subjective: ?Olivia Shaw is a 51 y.o. female patient seen today in office for POV # 7 (DOS 12/22/2020), S/P left Lapidus bunionectomy second hammertoe and second metatarsal osteotomy. Patient reports she is going to PT and states that the worst pain is at the second toe 8 out of 10 that flares up with swelling still at the knee and occasional pain to the ankle most recently felt a pop and is concerned about deeper injury.  Denies any other pedal complaints at this time. ? ?Patient Active Problem List  ? Diagnosis Date Noted  ? Paresthesia 10/01/2020  ? Perioral dermatitis 09/29/2020  ? Pain and swelling of lower leg 09/28/2020  ? DJD (degenerative joint disease), lumbar 09/28/2020  ? Asthma, chronic 09/28/2020  ? Headache above the eye region 04/02/2020  ? History of sinusitis 04/02/2020  ? Lesion of ulnar nerve 03/31/2020  ? Nasal ulcer 01/09/2019  ? Vasomotor rhinitis 01/09/2019  ? Hallux valgus, acquired, bilateral 10/20/2018  ? Low back pain 10/06/2018  ? 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 Chronic interstitial cystitis without hematuria 12/03/2015  ? Chronic LLQ pain 09/29/2015  ? Constipation by delayed colonic transit 09/29/2015  ? Increased frequency of urination 09/29/2015  ? Muscle spasm 09/29/2015  ? Chronic pelvic pain in female 09/25/2015  ? Endometriosis of pelvis 09/25/2015  ? Dense breasts 03/23/2015  ? Liver cyst 03/23/2015  ? Mild intermittent asthma 03/23/2015  ? Murmur 03/23/2015  ? Anemia 02/01/2015  ? 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 ? ?Current Outpatient Medications on File Prior to Visit  ?Medication Sig Dispense Refill  ? diazepam (VALIUM) 5 MG tablet Take by mouth.    ? estradiol (VIVELLE-DOT) 0.025 MG/24HR 1 patch twice a week    ? acetaminophen (TYLENOL) 325 MG tablet Take by mouth.    ? cetirizine (ZYRTEC) 10 MG tablet Take by mouth.    ? cetirizine (ZYRTEC) 10 MG tablet Take by mouth.    ? diazepam (VALIUM) 5 MG tablet Take 5 mg by mouth every 12 (twelve) hours as needed.  5  ?  estradiol (VIVELLE-DOT) 0.025 MG/24HR Place 1 patch onto the skin 2 (two) times a week.    ? ibuprofen (ADVIL) 800 MG tablet Take 1 tablet (800 mg total) by mouth every 8 (eight) hours as needed for mild pain. 30 tablet 0  ? oxyCODONE-acetaminophen (PERCOCET) 10-325 MG tablet Take 1 tablet by mouth every 6 (six) hours as needed.    ? promethazine (PHENERGAN) 12.5 MG tablet Take 1 tablet (12.5 mg total) by mouth every 8 (eight) hours as needed for nausea or vomiting. 20 tablet 0  ? rizatriptan (MAXALT-MLT) 5 MG disintegrating tablet Take 1 tablet (5 mg total) by mouth as needed for migraine. May repeat in 2 hours if needed 10 tablet 1  ? traMADol (ULTRAM) 50 MG tablet Take 50 mg by mouth every 6 (six) hours as needed.  5  ? triamcinolone cream (KENALOG) 0.1 % APPLY TO AFFECTED AREA TWICE A DAY FOR 2 WEEKS    ? ?No current facility-administered medications on file prior to visit.  ? ? ?Allergies  ?Allergen Reactions  ? Kiwi Extract Anaphylaxis  ? Latex Rash  ?  Other reaction(s): Unknown  ? Levofloxacin Hives  ?  Other reaction(s): Unknown  ? Sulfa Antibiotics Itching  ? Sulfamethoxazole-Trimethoprim   ?  Other reaction(s): Unknown  ? Doxycycline Palpitations  ?  Other reaction(s): ,tachycardia, itching  ? ? ?Objective: ?There were no vitals filed for this visit. ? ?General:  No acute distress, AAOx3  ?Left foot: Surgical site incisions well-healed, localized swelling to the left foot,  capillary fill time <3 seconds in all digits, gross sensation present via light touch, range of motion appears to be improving, there is mild elevation of the left second toe likely secondary to scar contracture, there is continued pain at the lateral ankle along the peroneal tendon course and just distal to the fibula we will order MRI for further evaluation since patient has felt a pop recently in this ankle.  No obvious instability noted.   ? ?X-rays consistent with postop status hardware intact there is mild swelling at the first  MPJ and incomplete fusion noted at the left second interphalangeal joint. ? ?Assessment and Plan:  ?Problem List Items Addressed This Visit   ?None ?Visit Diagnoses   ? ? S/P foot surgery, left    -  Primary  ? Relevant Orders  ? DG Foot Complete Left  ? Chronic pain of left ankle      ? Relevant Medications  ? acetaminophen (TYLENOL) 325 MG tablet  ? oxyCODONE-acetaminophen (PERCOCET) 10-325 MG tablet  ? Other Relevant Orders  ? MR ANKLE LEFT WO CONTRAST  ? Hammertoe of left foot      ? Bunion      ? Swelling      ? Left foot pain      ? ?  ?  ? ?-Patient seen and evaluated ?-X-rays reviewed ?-Continue with physical therapy and taping of the left second toe as directed ?-Continue with activities and shoes to tolerance ?-Continue with supportive garments to help with edema control as tolerated ?-Return to office after MRI of left ankle for further evaluation. ? ?Landis Martins, DPM  ?

## 2021-04-27 ENCOUNTER — Telehealth: Payer: Self-pay | Admitting: Sports Medicine

## 2021-04-27 NOTE — Telephone Encounter (Signed)
Patient wanted to make sure you include her whole foot in the MRI order due to pain also in her toes.  ?

## 2021-04-28 ENCOUNTER — Other Ambulatory Visit: Payer: Self-pay | Admitting: Sports Medicine

## 2021-04-28 DIAGNOSIS — M2042 Other hammer toe(s) (acquired), left foot: Secondary | ICD-10-CM

## 2021-04-28 DIAGNOSIS — Z9889 Other specified postprocedural states: Secondary | ICD-10-CM

## 2021-04-28 NOTE — Progress Notes (Signed)
Added on Foot MRI on left ?

## 2021-05-07 ENCOUNTER — Encounter: Payer: Self-pay | Admitting: Sports Medicine

## 2021-05-09 ENCOUNTER — Other Ambulatory Visit: Payer: BC Managed Care – PPO

## 2021-05-20 ENCOUNTER — Ambulatory Visit: Payer: BC Managed Care – PPO

## 2021-05-21 ENCOUNTER — Ambulatory Visit: Payer: BC Managed Care – PPO

## 2021-05-22 ENCOUNTER — Ambulatory Visit
Admission: RE | Admit: 2021-05-22 | Discharge: 2021-05-22 | Disposition: A | Discharge status: Home / Self Care | Payer: BC Managed Care – PPO | Source: Ambulatory Visit | Attending: Internal Medicine | Admitting: Internal Medicine

## 2021-05-22 ENCOUNTER — Other Ambulatory Visit: Payer: Self-pay | Admitting: Obstetrics and Gynecology

## 2021-05-22 DIAGNOSIS — Z1231 Encounter for screening mammogram for malignant neoplasm of breast: Secondary | ICD-10-CM

## 2021-05-25 ENCOUNTER — Other Ambulatory Visit: Payer: Self-pay | Admitting: Internal Medicine

## 2021-05-25 ENCOUNTER — Other Ambulatory Visit: Payer: Self-pay | Admitting: Obstetrics and Gynecology

## 2021-05-25 DIAGNOSIS — R928 Other abnormal and inconclusive findings on diagnostic imaging of breast: Secondary | ICD-10-CM

## 2021-05-28 ENCOUNTER — Other Ambulatory Visit: Payer: Self-pay | Admitting: Internal Medicine

## 2021-05-28 ENCOUNTER — Ambulatory Visit
Admission: RE | Admit: 2021-05-28 | Discharge: 2021-05-28 | Disposition: A | Discharge status: Home / Self Care | Payer: BC Managed Care – PPO | Source: Ambulatory Visit | Attending: Obstetrics and Gynecology | Admitting: Obstetrics and Gynecology

## 2021-05-28 DIAGNOSIS — R928 Other abnormal and inconclusive findings on diagnostic imaging of breast: Secondary | ICD-10-CM

## 2021-05-28 DIAGNOSIS — R921 Mammographic calcification found on diagnostic imaging of breast: Secondary | ICD-10-CM

## 2021-06-12 ENCOUNTER — Other Ambulatory Visit (HOSPITAL_COMMUNITY): Payer: Self-pay | Admitting: Diagnostic Radiology

## 2021-06-12 ENCOUNTER — Other Ambulatory Visit: Payer: Self-pay | Admitting: Internal Medicine

## 2021-06-12 ENCOUNTER — Ambulatory Visit
Admission: RE | Admit: 2021-06-12 | Discharge: 2021-06-12 | Disposition: A | Discharge status: Home / Self Care | Payer: BC Managed Care – PPO | Source: Ambulatory Visit | Attending: Internal Medicine | Admitting: Internal Medicine

## 2021-06-12 ENCOUNTER — Other Ambulatory Visit: Payer: Self-pay | Admitting: Diagnostic Radiology

## 2021-06-12 DIAGNOSIS — R921 Mammographic calcification found on diagnostic imaging of breast: Secondary | ICD-10-CM

## 2021-06-16 ENCOUNTER — Telehealth: Payer: Self-pay | Admitting: Hematology

## 2021-06-16 NOTE — Telephone Encounter (Signed)
Spoke to patient to confirm afternoon clinic appointment for 6/14, packet sent via e-mail

## 2021-06-18 ENCOUNTER — Encounter: Payer: Self-pay | Admitting: *Deleted

## 2021-06-18 DIAGNOSIS — D0512 Intraductal carcinoma in situ of left breast: Secondary | ICD-10-CM | POA: Insufficient documentation

## 2021-06-18 DIAGNOSIS — D0510 Intraductal carcinoma in situ of unspecified breast: Secondary | ICD-10-CM | POA: Insufficient documentation
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
## 2021-06-24 ENCOUNTER — Other Ambulatory Visit: Payer: Self-pay | Admitting: Lab

## 2021-06-24 ENCOUNTER — Ambulatory Visit: Payer: BC Managed Care – PPO | Admitting: Physical Therapy

## 2021-06-24 ENCOUNTER — Inpatient Hospital Stay: Payer: BC Managed Care – PPO

## 2021-06-24 ENCOUNTER — Encounter: Payer: Self-pay | Admitting: Hematology

## 2021-06-24 ENCOUNTER — Encounter: Payer: Self-pay | Admitting: Radiology

## 2021-06-24 ENCOUNTER — Inpatient Hospital Stay (HOSPITAL_BASED_OUTPATIENT_CLINIC_OR_DEPARTMENT_OTHER): Payer: BC Managed Care – PPO | Admitting: Genetic Counselor

## 2021-06-24 ENCOUNTER — Other Ambulatory Visit: Payer: Self-pay

## 2021-06-24 ENCOUNTER — Inpatient Hospital Stay: Payer: BC Managed Care – PPO | Attending: Hematology | Admitting: Hematology

## 2021-06-24 ENCOUNTER — Ambulatory Visit
Admission: RE | Admit: 2021-06-24 | Discharge: 2021-06-24 | Disposition: A | Discharge status: Home / Self Care | Payer: BC Managed Care – PPO | Source: Ambulatory Visit | Attending: Radiation Oncology | Admitting: Radiation Oncology

## 2021-06-24 VITALS — BP 145/63 | HR 92 | Temp 98.1°F | Resp 18 | Ht 65.0 in | Wt 121.2 lb

## 2021-06-24 DIAGNOSIS — D0512 Intraductal carcinoma in situ of left breast: Secondary | ICD-10-CM

## 2021-06-24 DIAGNOSIS — Z170 Estrogen receptor positive status [ER+]: Secondary | ICD-10-CM | POA: Diagnosis not present

## 2021-06-24 DIAGNOSIS — Z8041 Family history of malignant neoplasm of ovary: Secondary | ICD-10-CM

## 2021-06-24 LAB — CMP (CANCER CENTER ONLY)
ALT: 13 U/L (ref 0–44)
AST: 17 U/L (ref 15–41)
Albumin: 4.7 g/dL (ref 3.5–5.0)
Alkaline Phosphatase: 53 U/L (ref 38–126)
Anion gap: 8 (ref 5–15)
BUN: 16 mg/dL (ref 6–20)
CO2: 27 mmol/L (ref 22–32)
Calcium: 9.6 mg/dL (ref 8.9–10.3)
Chloride: 103 mmol/L (ref 98–111)
Creatinine: 0.83 mg/dL (ref 0.44–1.00)
GFR, Estimated: >60 mL/min (ref >60)
Glucose, Bld: 116 mg/dL — ABNORMAL HIGH (ref 70–99)
Potassium: 4.1 mmol/L (ref 3.5–5.1)
Sodium: 138 mmol/L (ref 135–145)
Total Bilirubin: 0.6 mg/dL (ref 0.3–1.2)
Total Protein: 7.2 g/dL (ref 6.5–8.1)

## 2021-06-24 LAB — CBC WITH DIFFERENTIAL (CANCER CENTER ONLY)
Abs Immature Granulocytes: 0.01 10*3/uL (ref 0.00–0.07)
Basophils Absolute: 0 10*3/uL (ref 0.0–0.1)
Basophils Relative: 0 %
Eosinophils Absolute: 0 10*3/uL (ref 0.0–0.5)
Eosinophils Relative: 0 %
HCT: 37.3 % (ref 36.0–46.0)
Hemoglobin: 12.5 g/dL (ref 12.0–15.0)
Immature Granulocytes: 0 %
Lymphocytes Relative: 33 %
Lymphs Abs: 1.6 10*3/uL (ref 0.7–4.0)
MCH: 29.6 pg (ref 26.0–34.0)
MCHC: 33.5 g/dL (ref 30.0–36.0)
MCV: 88.4 fL (ref 80.0–100.0)
Monocytes Absolute: 0.4 10*3/uL (ref 0.1–1.0)
Monocytes Relative: 9 %
Neutro Abs: 2.7 10*3/uL (ref 1.7–7.7)
Neutrophils Relative %: 58 %
Platelet Count: 267 10*3/uL (ref 150–400)
RBC: 4.22 MIL/uL (ref 3.87–5.11)
RDW: 13.1 % (ref 11.5–15.5)
WBC Count: 4.8 10*3/uL (ref 4.0–10.5)
nRBC: 0 % (ref 0.0–0.2)

## 2021-06-24 LAB — GENETIC SCREENING ORDER
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
## 2021-06-24 NOTE — Research (Signed)
MTG-015 - Tissue and Bodily Fluids: Translational Medicine: Discovery and Evaluation of Biomarkers/Pharmacogenomics for the Diagnosis and Personalized Management of Patients    06/24/2021   ELIGIBILITY: This Coordinator has reviewed this patient's inclusion and exclusion criteria and confirmed Olivia Shaw is eligible for study participation.  Patient will continue with enrollment.  Menopausal status (women only): Olivia Shaw has had a hysterectomy.   Eligibility confirmed by research nurse and treating investigator, who also agrees that patient should proceed with enrollment.   CONSENT INTRO:  Patient Olivia Shaw was identified by Dr. Burr Medico as a potential candidate for the above listed study.  This Clinical Research Coordinator met with Olivia Shaw, [id number], on 06/24/21 in a manner and location that ensures patient privacy to discuss participation in the above listed research study.  Patient is Accompanied by her mom .  A copy of the informed consent document and separate HIPAA Authorization was provided to the patient.  Patient reads, speaks, and understands [place].    Patient was provided with the business card of this Coordinator and encouraged to contact the research team with any questions.  Patient was provided the option of taking informed consent documents home to review and was encouraged to review at their convenience with their support network, including other care providers. Patient is comfortable with making a decision regarding study participation today.  As outlined in the informed consent form, this Coordinator and Olivia Shaw discussed the purpose of the research study, the investigational nature of the study, study procedures and requirements for study participation, potential risks and benefits of study participation, as well as alternatives to participation. The patient understands participation is voluntary and they may withdraw from study  participation at any time.  This study does not involve randomization.  This study does not involve an investigational drug or device. This study does not involve a placebo. Patient understands enrollment is pending full eligibility review.   Confidentiality and how the patient's information will be used as part of study participation were discussed.  Patient was informed there is reimbursement provided for their time and effort spent on trial participation.  The patient is encouraged to discuss research study participation with their insurance provider to determine what costs they may incur as part of study participation, including research related injury.    All questions were answered to patient's satisfaction.  The informed consent and separate HIPAA Authorization was reviewed page by page.  The patient's mental and emotional status is appropriate to provide informed consent, and the patient verbalizes an understanding of study participation.  Patient has agreed to participate in the above listed research study and has voluntarily signed the informed consent version  dated 12/01/2020 and separate HIPPA version 5, dated 12/27/18 on 06/24/21 at 3:43PM.  The patient was provided with a copy of the signed informed consent form with embedded HIPAA language for their reference.  No study specific procedures were obtained prior to the signing of the informed consent document.  Approximately 20 minutes were spent with the patient reviewing the informed consent documents.  Patient was not requested to complete a Release of Information form.  This coordinator will request a future lab appointment and complete all other protocol related requirements for the above mentioned study. Patient requested lab appt on Friday 6/23 at [place]. Patient and her mom were thanked for their time.   Olivia Shaw, RT(R)(T) Clinical Research Coordinator

## 2021-06-25 ENCOUNTER — Encounter: Payer: Self-pay | Admitting: Genetic Counselor

## 2021-06-25 ENCOUNTER — Other Ambulatory Visit: Payer: Self-pay

## 2021-06-25 ENCOUNTER — Encounter: Payer: Self-pay | Admitting: General Practice

## 2021-06-25 DIAGNOSIS — D0512 Intraductal carcinoma in situ of left breast: Secondary | ICD-10-CM

## 2021-06-25 DIAGNOSIS — Z8041 Family history of malignant neoplasm of ovary: Secondary | ICD-10-CM | POA: Insufficient documentation

## 2021-06-25 NOTE — Progress Notes (Signed)
[facility] Psychosocial Distress Screening Spiritual Care  Met with Febe by phone for one hour following Breast Multidisciplinary Clinic to introduce [facility] team/resources, reviewing distress screen per protocol.  The patient scored a 9 on the Psychosocial Distress Thermometer which indicates severe distress. Also assessed for distress and other psychosocial needs.    06/25/2021  ONCBCN DISTRESS SCREENING   Screening Type Initial Screening   Distress experienced in past week (1-10) 9 !   Practical problem type Work/school   Emotional problem type Nervousness/Anxiety;Adjusting to illness;Isolation/feeling alone   Spiritual/Religous concerns type Facing my mortality   Information Concerns Type Lack of info about diagnosis;Lack of info about treatment;Lack of info about complementary therapy choices   Physical Problem type Pain;Sleep/insomnia;Loss of appetitie   Referral to support programs Yes    Emmer was welcoming of [facility] follow-up call and used the opportunity to share and process her thoughts, feelings, and discernment process about treatment path. She teaches anatomy/physiology, has a clinical doctorate, and values highly nuanced conversation about possible medical interventions, side effects, risks, etc.  Provided empathic listening, emotional support, normalization of feelings, and affirmation of strengths (including a very constructive relationship with a counselor, as well as working to reframe the anger she feels about her diagnosis as a potential source of fuel for coping).  We plan to follow up by phone in ca two weeks.   Follow up needed: Yes.     [place], [place], [facility] Pager [phone] Voicemail [phone]

## 2021-06-26 ENCOUNTER — Encounter: Payer: Self-pay | Admitting: *Deleted

## 2021-06-26 ENCOUNTER — Telehealth: Payer: Self-pay | Admitting: Radiology

## 2021-06-26 NOTE — Telephone Encounter (Signed)
MTG-015 - Tissue and Bodily Fluids: Translational Medicine: Discovery and Evaluation of Biomarkers/Pharmacogenomics for the Diagnosis and Personalized Management of Patients    06/26/21  PHONE CALL: LVM for patient to confirm lab appt for Friday 6/23 at [place], RT(R)(T) Clinical Research Coordinator

## 2021-07-02 ENCOUNTER — Telehealth: Payer: Self-pay | Admitting: *Deleted

## 2021-07-02 ENCOUNTER — Telehealth: Payer: Self-pay | Admitting: Radiology

## 2021-07-02 NOTE — Telephone Encounter (Signed)
Called pt regarding [place] From 6.14.23. Left vm requesting return call for questions or needs.

## 2021-07-02 NOTE — Telephone Encounter (Signed)
MTG-015 - Tissue and Bodily Fluids: Translational Medicine: Discovery and Evaluation of Biomarkers/Pharmacogenomics for the Diagnosis and Personalized Management of Patients    07/02/2021  PHONE CALL: LVM for patient to confirm labs at [place], Friday 07/03/21. On v/m informed patient we could complete study questions over the phone today or tomorrow in person.   Carol Ada, RT(R)(T) Clinical Research Coordinator

## 2021-07-03 ENCOUNTER — Encounter: Payer: Self-pay | Admitting: *Deleted

## 2021-07-03 ENCOUNTER — Telehealth: Payer: Self-pay | Admitting: Genetic Counselor

## 2021-07-03 ENCOUNTER — Other Ambulatory Visit: Payer: Self-pay

## 2021-07-03 ENCOUNTER — Encounter: Payer: Self-pay | Admitting: Genetic Counselor

## 2021-07-03 ENCOUNTER — Encounter: Payer: Self-pay | Admitting: Radiology

## 2021-07-03 ENCOUNTER — Inpatient Hospital Stay: Payer: BC Managed Care – PPO

## 2021-07-03 DIAGNOSIS — D0512 Intraductal carcinoma in situ of left breast: Secondary | ICD-10-CM

## 2021-07-03 DIAGNOSIS — Z1379 Encounter for other screening for genetic and chromosomal anomalies: Secondary | ICD-10-CM | POA: Insufficient documentation

## 2021-07-03 LAB — RESEARCH LABS

## 2021-07-06 ENCOUNTER — Telehealth: Payer: Self-pay | Admitting: Genetic Counselor

## 2021-07-17 ENCOUNTER — Telehealth: Payer: Self-pay | Admitting: Genetic Counselor

## 2021-07-17 NOTE — Telephone Encounter (Signed)
I contacted Olivia Shaw to discuss her genetic testing results. No pathogenic variants were identified in the 77 genes analyzed. Detailed clinic note to follow.  The test report has been scanned into EPIC and is located under the Molecular Pathology section of the Results Review tab.  A portion of the result report is included below for reference.   [name], MS, [facility] Genetic Counselor [name] (P) [phone]

## 2021-07-20 ENCOUNTER — Telehealth: Payer: Self-pay | Admitting: *Deleted

## 2021-07-20 ENCOUNTER — Ambulatory Visit: Payer: Self-pay | Admitting: Genetic Counselor

## 2021-07-20 DIAGNOSIS — Z1379 Encounter for other screening for genetic and chromosomal anomalies: Secondary | ICD-10-CM

## 2021-07-20 NOTE — Progress Notes (Signed)
HPI:   Olivia Shaw was previously seen in the [place] clinic due to a personal and family history of cancer and concerns regarding a hereditary predisposition to cancer. Please refer to our prior cancer genetics clinic note for more information regarding our discussion, assessment and recommendations, at the time. Olivia Shaw recent genetic test results were disclosed to her, as were recommendations warranted by these results. These results and recommendations are discussed in more detail below.  CANCER HISTORY:  Oncology History Overview Note   Cancer Staging  Ductal carcinoma in situ (DCIS) of left breast Staging form: Breast, AJCC 8th Edition - Clinical stage from 06/12/2021: Stage 0 (cTis (DCIS), cN0, cM0, G2, ER+, PR+, HER2: Not Assessed) - Signed by Truitt Merle, MD on 06/23/2021   Ductal carcinoma in situ (DCIS) of left breast  05/28/2021 Mammogram   CLINICAL DATA:  Patient returns today to evaluate LEFT breast calcifications identified on a recent screening mammogram.   EXAM: DIGITAL DIAGNOSTIC UNILATERAL LEFT MAMMOGRAM WITH CAD  IMPRESSION: Grouped punctate and amorphous calcifications within the upper inner quadrant of the LEFT breast, at posterior depth, measuring 6 mm extent. Stereotactic biopsy is recommended to exclude malignancy.   06/12/2021 Cancer Staging   Staging form: Breast, AJCC 8th Edition - Clinical stage from 06/12/2021: Stage 0 (cTis (DCIS), cN0, cM0, G2, ER+, PR+, HER2: Not Assessed) - Signed by Truitt Merle, MD on 06/23/2021 Stage prefix: Initial diagnosis Histologic grading system: 3 grade system   06/12/2021 Initial Biopsy   Diagnosis Breast, left, needle core biopsy, uiq - DUCTAL CARCINOMA IN SITU, CRIBRIFORM WITH NECROSIS AND CALCIFICATIONS. - SEE NOTE. Diagnosis Note The DCIS has intermediate nuclear grade and is 0.6 cm in greatest extent.  PROGNOSTIC INDICATORS Results: Estrogen Receptor: 100%, POSITIVE, STRONG STAINING INTENSITY Progesterone  Receptor: 70%, POSITIVE, STRONG STAINING INTENSITY   06/18/2021 Initial Diagnosis   Ductal carcinoma in situ (DCIS) of left breast    Genetic Testing   [facility] CancerNext-Expanded Panel was Negative. Report date is 07/12/2021.  The CancerNext-Expanded gene panel offered by [facility] and includes sequencing, rearrangement, and RNA analysis for the following 77 genes: AIP, ALK, APC, ATM, AXIN2, BAP1, BARD1, BLM, BMPR1A, BRCA1, BRCA2, BRIP1, CDC73, CDH1, CDK4, CDKN1B, CDKN2A, CHEK2, CTNNA1, DICER1, FANCC, FH, FLCN, GALNT12, KIF1B, LZTR1, MAX, MEN1, MET, MLH1, MSH2, MSH3, MSH6, MUTYH, NBN, NF1, NF2, NTHL1, PALB2, PHOX2B, PMS2, POT1, PRKAR1A, PTCH1, PTEN, RAD51C, RAD51D, RB1, RECQL, RET, SDHA, SDHAF2, SDHB, SDHC, SDHD, SMAD4, SMARCA4, SMARCB1, SMARCE1, STK11, SUFU, TMEM127, TP53, TSC1, TSC2, VHL and XRCC2 (sequencing and deletion/duplication); EGFR, EGLN1, HOXB13, KIT, MITF, PDGFRA, POLD1, and POLE (sequencing only); EPCAM and GREM1 (deletion/duplication only).      FAMILY HISTORY:  We obtained a detailed, 4-generation family history.  Significant diagnoses are listed below:      Family History  Problem Relation Age of Onset   Ovarian cancer Mother 65        negative genetic testing   Cataracts Mother     High blood pressure Mother     Osteoarthritis Mother     Asthma Father          allergies   Osteoarthritis Father     Testicular cancer Maternal Uncle     High blood pressure Maternal Grandmother     Osteoarthritis Paternal Grandmother     Breast cancer Neg Hx           Olivia Shaw's mother was diagnosed with ovarian cancer at age 8, she reportedly had negative hereditary cancer genetic  testing. Her maternal uncle was diagnosed with testicular cancer. Her paternal great aunt was diagnosed with a gynecological cancer at an unknown age, she is deceased. There is no reported Ashkenazi Jewish ancestry.   GENETIC TEST RESULTS:  The [facility] CancerNext-Expanded Panel found no pathogenic  mutations.   The CancerNext-Expanded gene panel offered by [facility] and includes sequencing, rearrangement, and RNA analysis for the following 77 genes: AIP, ALK, APC, ATM, AXIN2, BAP1, BARD1, BLM, BMPR1A, BRCA1, BRCA2, BRIP1, CDC73, CDH1, CDK4, CDKN1B, CDKN2A, CHEK2, CTNNA1, DICER1, FANCC, FH, FLCN, GALNT12, KIF1B, LZTR1, MAX, MEN1, MET, MLH1, MSH2, MSH3, MSH6, MUTYH, NBN, NF1, NF2, NTHL1, PALB2, PHOX2B, PMS2, POT1, PRKAR1A, PTCH1, PTEN, RAD51C, RAD51D, RB1, RECQL, RET, SDHA, SDHAF2, SDHB, SDHC, SDHD, SMAD4, SMARCA4, SMARCB1, SMARCE1, STK11, SUFU, TMEM127, TP53, TSC1, TSC2, VHL and XRCC2 (sequencing and deletion/duplication); EGFR, EGLN1, HOXB13, KIT, MITF, PDGFRA, POLD1, and POLE (sequencing only); EPCAM and GREM1 (deletion/duplication only).   The test report has been scanned into EPIC and is located under the Molecular Pathology section of the Results Review tab.  A portion of the result report is included below for reference. Genetic testing reported out on 07/12/2021.        Even though a pathogenic variant was not identified, possible explanations for her personal history of cancer may include: There may be no hereditary risk for cancer in the family. The cancers in Olivia Shaw and/or her family may be due to other genetic or environmental factors. There may be a gene mutation in one of these genes that current testing methods cannot detect, but that chance is small. There could be another gene that has not yet been discovered, or that we have not yet tested, that is responsible for the cancer diagnoses in the family.   Therefore, it is important to remain in touch with cancer genetics in the future so that we can continue to offer Olivia Shaw the most up to date genetic testing.   ADDITIONAL GENETIC TESTING:  We discussed with Olivia Shaw that her genetic testing was fairly extensive.  If there are genes identified to increase cancer risk that can be analyzed in the future, we would be  happy to discuss and coordinate this testing at that time.    CANCER SCREENING RECOMMENDATIONS:  Olivia Shaw test result is considered negative (normal).  This means that we have not identified a hereditary cause for her personal and family history of cancer at this time.   An individual's cancer risk and medical management are not determined by genetic test results alone. Overall cancer risk assessment incorporates additional factors, including personal medical history, family history, and any available genetic information that may result in a personalized plan for cancer prevention and surveillance. Therefore, it is recommended she continue to follow the cancer management and screening guidelines provided by her oncology and primary healthcare provider.  FOLLOW-UP:  Cancer genetics is a rapidly advancing field and it is possible that new genetic tests will be appropriate for her and/or her family members in the future. We encouraged her to remain in contact with cancer genetics on an annual basis so we can update her personal and family histories and let her know of advances in cancer genetics that may benefit this family.   Our contact number was provided. Olivia Shaw questions were answered to her satisfaction, and she knows she is welcome to call us at anytime with additional questions or concerns.   [name], MS, [facility] Genetic Counselor [email or web address] (P) [phone]

## 2021-07-20 NOTE — Telephone Encounter (Signed)
Received call from pt stating she has decided to go to [facility] for cancer care. Physician team notified.

## 2021-07-29 ENCOUNTER — Encounter: Payer: Self-pay | Admitting: Genetic Counselor

## 2021-09-04 ENCOUNTER — Ambulatory Visit: Payer: BC Managed Care – PPO | Admitting: Podiatry

## 2021-09-04 ENCOUNTER — Ambulatory Visit (INDEPENDENT_AMBULATORY_CARE_PROVIDER_SITE_OTHER): Payer: BC Managed Care – PPO

## 2021-09-04 DIAGNOSIS — M1990 Unspecified osteoarthritis, unspecified site: Secondary | ICD-10-CM

## 2021-09-04 DIAGNOSIS — M2042 Other hammer toe(s) (acquired), left foot: Secondary | ICD-10-CM

## 2021-09-04 DIAGNOSIS — M79672 Pain in left foot: Secondary | ICD-10-CM

## 2021-09-04 DIAGNOSIS — M25372 Other instability, left ankle: Secondary | ICD-10-CM

## 2021-09-04 NOTE — Progress Notes (Unsigned)
Subjective: Chief Complaint  Patient presents with   Foot Pain    Patient fell twice and hurt the left foot 2nd toe that she had surgery on Dec 22 2020, toe is rotated and is having pain, pt is also have left lateral ankle pain,  TX: P.T.     51 year old female presents the office today for review of skin concerns.  She previously had Lapidus bunionectomy as well as hammertoe repair performed by Dr. Cannon Kettle.  She states that during the postoperative course she did have 2 injuries to her foot.  She states that her main concern is her second toe however it is twisted.  This causes discomfort.  She has also been having some lateral ankle discomfort.  MRI was ordered previously but was not completed and she wants to have the MRI reordered and start the process again.  She is interested in surgical intervention this year given her foot pain.  Objective: AAO x3, NAD DP/PT pulses palpable bilaterally, CRT less than 3 seconds Joint tenderness is localized along the left second toe and there is rotational deformity noted to the second toe.  There is residual bunion deformity noted as well but not causing significant pain but there is tenderness.  Incision for prior surgery well-healed.  Mild discomfort along the lateral aspect of the ankle and distal mild increase in anterior drawer compared to contralateral extremity.  MMT 5/5. No pain with calf compression, swelling, warmth, erythema  Assessment: Left ankle injury, ankle instability; left second toe pain, residual bunion  Plan: -All treatment options discussed with the patient including all alternatives, risks, complications.  -X-rays obtained reviewed.  3 views of the foot and 2 views of the ankle were obtained.  There is no evidence of acute fracture.  Previous bunionectomy with residual deformity as well as malunion of the second digit. -We discussed both conservative as well as surgical treatment options.  Order MRI for surgical planning of the left  ankle and foot.  She is attempted conservative care without significant resolution at this point MRI was ordered for surgical planning. -For now continue with conservative care and supportive shoe gear. -Patient encouraged to call the office with any questions, concerns, change in symptoms.   Trula Slade DPM

## 2021-09-21 ENCOUNTER — Other Ambulatory Visit: Payer: BC Managed Care – PPO

## 2021-09-29 DIAGNOSIS — Z90710 Acquired absence of both cervix and uterus: Secondary | ICD-10-CM | POA: Insufficient documentation

## 2021-09-29 DIAGNOSIS — D259 Leiomyoma of uterus, unspecified: Secondary | ICD-10-CM | POA: Insufficient documentation

## 2021-09-29 DIAGNOSIS — C50919 Malignant neoplasm of unspecified site of unspecified female breast: Secondary | ICD-10-CM | POA: Insufficient documentation

## 2021-10-02 ENCOUNTER — Telehealth: Payer: Self-pay

## 2021-10-02 NOTE — Telephone Encounter (Signed)
Encounter Created in error

## 2021-10-07 ENCOUNTER — Ambulatory Visit
Admission: RE | Admit: 2021-10-07 | Discharge: 2021-10-07 | Disposition: A | Discharge status: Home / Self Care | Payer: BC Managed Care – PPO | Source: Ambulatory Visit | Attending: Podiatry | Admitting: Podiatry

## 2021-10-07 DIAGNOSIS — M1990 Unspecified osteoarthritis, unspecified site: Secondary | ICD-10-CM

## 2021-10-07 DIAGNOSIS — M25372 Other instability, left ankle: Secondary | ICD-10-CM

## 2021-10-08 ENCOUNTER — Telehealth: Payer: Self-pay | Admitting: Podiatry

## 2021-10-08 NOTE — Telephone Encounter (Signed)
PT is requesting a prescription for dexamethasone 0.4%. I have called this into [facility] for her.

## 2021-10-13 ENCOUNTER — Telehealth: Payer: Self-pay | Admitting: Podiatry

## 2021-10-13 NOTE — Telephone Encounter (Signed)
I attempted to call patient to go over MRI results. No answer. Left VM to call back to discuss.

## 2021-10-14 DIAGNOSIS — S92512A Displaced fracture of proximal phalanx of left lesser toe(s), initial encounter for closed fracture: Secondary | ICD-10-CM | POA: Insufficient documentation

## 2021-10-23 NOTE — Telephone Encounter (Signed)
I called patient again to follow-up on the MRI. She got my message but then went on vacation but read the MRI report. Since there was more going on she saw Dr. Doran Durand and he ordered blood work. She is going to follow-up with him for now and she will call back if she needs something. I let her know if she needs anything to give [place] a call. She had no further questions.

## 2021-10-28 DIAGNOSIS — M2022 Hallux rigidus, left foot: Secondary | ICD-10-CM | POA: Insufficient documentation

## 2021-10-28 DIAGNOSIS — T849XXA Unspecified complication of internal orthopedic prosthetic device, implant and graft, initial encounter: Secondary | ICD-10-CM | POA: Insufficient documentation

## 2021-11-09 ENCOUNTER — Encounter (INDEPENDENT_AMBULATORY_CARE_PROVIDER_SITE_OTHER): Payer: Self-pay

## 2021-12-31 ENCOUNTER — Other Ambulatory Visit: Payer: Self-pay | Admitting: Internal Medicine

## 2021-12-31 ENCOUNTER — Ambulatory Visit
Admission: RE | Admit: 2021-12-31 | Discharge: 2021-12-31 | Disposition: A | Discharge status: Home / Self Care | Payer: BC Managed Care – PPO | Source: Ambulatory Visit | Attending: Internal Medicine | Admitting: Internal Medicine

## 2021-12-31 DIAGNOSIS — R059 Cough, unspecified: Secondary | ICD-10-CM

## 2021-12-31 DIAGNOSIS — R0602 Shortness of breath: Secondary | ICD-10-CM

## 2022-02-18 DIAGNOSIS — M1812 Unilateral primary osteoarthritis of first carpometacarpal joint, left hand: Secondary | ICD-10-CM | POA: Insufficient documentation

## 2022-02-18 DIAGNOSIS — M79642 Pain in left hand: Secondary | ICD-10-CM | POA: Insufficient documentation

## 2022-02-19 DIAGNOSIS — M19022 Primary osteoarthritis, left elbow: Secondary | ICD-10-CM | POA: Insufficient documentation

## 2022-05-05 ENCOUNTER — Encounter: Payer: Self-pay | Admitting: Hematology

## 2022-05-19 ENCOUNTER — Ambulatory Visit (INDEPENDENT_AMBULATORY_CARE_PROVIDER_SITE_OTHER): Payer: BC Managed Care – PPO

## 2022-05-19 ENCOUNTER — Other Ambulatory Visit: Payer: Self-pay | Admitting: Cardiology

## 2022-05-19 ENCOUNTER — Telehealth: Payer: Self-pay

## 2022-05-19 ENCOUNTER — Encounter: Payer: Self-pay | Admitting: Cardiology

## 2022-05-19 ENCOUNTER — Encounter: Payer: Self-pay | Admitting: Podiatry

## 2022-05-19 ENCOUNTER — Ambulatory Visit: Payer: BC Managed Care – PPO | Attending: Cardiology | Admitting: Cardiology

## 2022-05-19 VITALS — BP 118/86 | HR 78 | Ht 65.0 in | Wt 124.2 lb

## 2022-05-19 DIAGNOSIS — R9431 Abnormal electrocardiogram [ECG] [EKG]: Secondary | ICD-10-CM

## 2022-05-19 DIAGNOSIS — G4719 Other hypersomnia: Secondary | ICD-10-CM | POA: Diagnosis not present

## 2022-05-19 DIAGNOSIS — I10 Essential (primary) hypertension: Secondary | ICD-10-CM

## 2022-05-19 DIAGNOSIS — R002 Palpitations: Secondary | ICD-10-CM

## 2022-05-19 DIAGNOSIS — I359 Nonrheumatic aortic valve disorder, unspecified: Secondary | ICD-10-CM | POA: Diagnosis not present

## 2022-05-19 DIAGNOSIS — R011 Cardiac murmur, unspecified: Secondary | ICD-10-CM

## 2022-05-19 NOTE — Addendum Note (Signed)
Addended by: Luellen Pucker on: 05/19/2022 09:08 AM   Modules accepted: Orders

## 2022-05-19 NOTE — Telephone Encounter (Signed)
Olivia Shaw sleep study was ordered by Dr. Mayford Knife in clinic today. Patient was given the device and is aware not to open it until she hears from our office. The device has been registered.

## 2022-05-19 NOTE — Progress Notes (Unsigned)
Enrolled for [employer] to mail a ZIO XT long term holter monitor to the patients address on file.  

## 2022-05-19 NOTE — Telephone Encounter (Signed)
Called and spoke with staff, requested copy of Echo from 2017. Gave fax number as well as my direct number.

## 2022-05-19 NOTE — Patient Instructions (Signed)
Medication Instructions:  Your physician recommends that you continue on your current medications as directed. Please refer to the Current Medication list given to you today.  *If you need a refill on your cardiac medications before your next appointment, please call your pharmacy*   Lab Work: None.  If you have labs (blood work) drawn today and your tests are completely normal, you will receive your results only by: MyChart Message (if you have MyChart) OR A paper copy in the mail If you have any lab test that is abnormal or we need to change your treatment, we will call you to review the results.   Testing/Procedures: Your physician has recommended that you have a sleep study. This test records several body functions during sleep, including: brain activity, eye movement, oxygen and carbon dioxide blood levels, heart rate and rhythm, breathing rate and rhythm, the flow of air through your mouth and nose, snoring, body muscle movements, and chest and belly movement.  Your physician has requested that you have a coronary calcium score CT. This is a short test that can help calculate your risk of coronary artery disease. Patient cost is usually $94-99.   Your physician has requested that you have an echocardiogram. Echocardiography is a painless test that uses sound waves to create images of your heart. It provides your doctor with information about the size and shape of your heart and how well your heart's chambers and valves are working. This procedure takes approximately one hour. There are no restrictions for this procedure. Please do NOT wear cologne, perfume, aftershave, or lotions (deodorant is allowed). Please arrive 15 minutes prior to your appointment time.  ZIO AT Long term monitor-Live Telemetry  Your physician has requested you wear a ZIO patch monitor for 14 days.  This is a single patch monitor. [employer] supplies one patch monitor per enrollment. Additional  stickers are not  available.  Please do not apply patch if you will be having a Nuclear Stress Test, Echocardiogram, Cardiac CT, MRI,  or Chest Xray during the period you would be wearing the monitor. The patch cannot be worn during  these tests. You cannot remove and re-apply the ZIO AT patch monitor.  Your ZIO patch monitor will be mailed 3 day USPS to your address on file. It may take 3-5 days to  receive your monitor after you have been enrolled.  Once you have received your monitor, please review the enclosed instructions. Your monitor has  already been registered assigning a specific monitor serial # to you.   Billing and Patient Assistance Program information  Meredeth Ide has been supplied with any insurance information on record for billing. [employer] offers a sliding scale Patient Assistance Program for patients without insurance, or whose  insurance does not completely cover the cost of the ZIO patch monitor. You must apply for the  Patient Assistance Program to qualify for the discounted rate. To apply, call [employer] at [phone],  select option 4, select option 2 , ask to apply for the Patient Assistance Program, (you can request an  interpreter if needed). [employer] will ask your household income and how many people are in your  household. [employer] will quote your out-of-pocket cost based on this information. They will also be able  to set up a 12 month interest free payment plan if needed.  Applying the monitor   Shave hair from upper left chest.  Hold the abrader disc by orange tab. Rub the abrader in 40 strokes over left upper chest  as indicated in  your monitor instructions.  Clean area with 4 enclosed alcohol pads. Use all pads to ensure the area is cleaned thoroughly. Let  dry.  Apply patch as indicated in monitor instructions. Patch will be placed under collarbone on left side of  chest with arrow pointing upward.  Rub patch adhesive wings for 2 minutes. Remove the white label marked "1".  Remove the white label  marked "2". Rub patch adhesive wings for 2 additional minutes.  While looking in a mirror, press and release button in center of patch. A small green light will flash 3-4  times. This will be your only indicator that the monitor has been turned on.  Do not shower for the first 24 hours. You may shower after the first 24 hours.  Press the button if you feel a symptom. You will hear a small click. Record Date, Time and Symptom in  the Patient Log.   Starting the Gateway  In your kit there is a Audiological scientist box the size of a cellphone. This is Buyer, retail. It transmits all your  recorded data to [facility]. This box must always stay within 10 feet of you. Open the box and push the *  button. There will be a light that blinks orange and then green a few times. When the light stops  blinking, the Gateway is connected to the ZIO patch. Call [employer] at [phone] to confirm your monitor is transmitting.  Returning your monitor  Remove your patch and place it inside the Gateway. In the lower half of the Gateway there is a white  bag with prepaid postage on it. Place Gateway in bag and seal. Mail package back to [facility] as soon as  possible. Your physician should have your final report approximately 7 days after you have mailed back  your monitor. Call [facility] Customer Care at [phone] if you have questions regarding your ZIO AT  patch monitor. Call them immediately if you see an orange light blinking on your monitor.  If your monitor falls off in less than 4 days, contact our Monitor department at [phone]. If your  monitor becomes loose or falls off after 4 days call [employer] at [phone] for suggestions on  securing your monitor    Follow-Up: At [facility], you and your health needs are our priority.  As part of our continuing mission to provide you with exceptional heart care, we have created designated Provider Care Teams.   These Care Teams include your primary Cardiologist (physician) and Advanced Practice Providers (APPs -  Physician Assistants and Nurse Practitioners) who all work together to provide you with the care you need, when you need it.  We recommend signing up for the patient portal called "MyChart".  Sign up information is provided on this After Visit Summary.  MyChart is used to connect with patients for Virtual Visits (Telemedicine).  Patients are able to view lab/test results, encounter notes, upcoming appointments, etc.  Non-urgent messages can be sent to your provider as well.   To learn more about what you can do with MyChart, go to [email or web address].    Your next appointment will be dependent on the results of your testing and it will be with:     Provider:   Dr. Armanda Magic, MD   Other Instructions Please check your records for an Echocardiogram report from 2017 as well as any chest CT's you may have had in the past. If you know which physician ordered these  tests, please provide their name so we can get these records.
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
## 2022-05-21 DIAGNOSIS — R002 Palpitations: Secondary | ICD-10-CM | POA: Diagnosis not present

## 2022-05-21 DIAGNOSIS — R011 Cardiac murmur, unspecified: Secondary | ICD-10-CM | POA: Diagnosis not present

## 2022-05-21 DIAGNOSIS — R9431 Abnormal electrocardiogram [ECG] [EKG]: Secondary | ICD-10-CM | POA: Diagnosis not present

## 2022-05-24 ENCOUNTER — Telehealth: Payer: Self-pay

## 2022-05-24 NOTE — Telephone Encounter (Signed)
Called patient to let her know that while screenshot of MRA report came over, Echo report screen shots did not have the summary or interpretation section that we were needing. I have also called over to MD's office where she originally had Echo and have not been able to get copies of report. Left message per DPR asking for patient assistance with obtaining these records.

## 2022-05-24 NOTE — Telephone Encounter (Signed)
Prior Authorization for ITAMAR sent to [employer] via web portal. Tracking Number . [id number] 

## 2022-05-25 NOTE — Telephone Encounter (Signed)
Left message for the pt that sleep study has been approved and her PIN# is [id number]. Left request for pt if she could please do her sleep study one night this week by the weekend. If not please call and let me know if she can do the study by one night next week.

## 2022-05-27 ENCOUNTER — Encounter: Payer: Self-pay | Admitting: Cardiology

## 2022-05-27 DIAGNOSIS — I341 Nonrheumatic mitral (valve) prolapse: Secondary | ICD-10-CM | POA: Insufficient documentation

## 2022-05-27 DIAGNOSIS — Q254 Congenital malformation of aorta unspecified: Secondary | ICD-10-CM | POA: Insufficient documentation

## 2022-06-04 ENCOUNTER — Ambulatory Visit (HOSPITAL_COMMUNITY)
Admission: RE | Admit: 2022-06-04 | Discharge: 2022-06-04 | Disposition: A | Discharge status: Home / Self Care | Payer: BC Managed Care – PPO | Source: Ambulatory Visit | Attending: Cardiology | Admitting: Cardiology

## 2022-06-04 DIAGNOSIS — R9431 Abnormal electrocardiogram [ECG] [EKG]: Secondary | ICD-10-CM | POA: Insufficient documentation

## 2022-06-07 ENCOUNTER — Encounter (INDEPENDENT_AMBULATORY_CARE_PROVIDER_SITE_OTHER): Payer: BC Managed Care – PPO | Admitting: Cardiology

## 2022-06-07 DIAGNOSIS — G4733 Obstructive sleep apnea (adult) (pediatric): Secondary | ICD-10-CM

## 2022-06-09 ENCOUNTER — Telehealth: Payer: Self-pay

## 2022-06-09 NOTE — Telephone Encounter (Signed)
Called patient to review coronary calcium score CT, patient verbalizes understanding of normal result.

## 2022-06-09 NOTE — Telephone Encounter (Signed)
-----   Message from Meriam Sprague, MD sent at 06/08/2022  4:56 PM EDT ----- Her coronary Ca score is 0 which is great news and means she is low CV risk

## 2022-06-15 ENCOUNTER — Encounter: Payer: Self-pay | Admitting: Cardiology

## 2022-06-15 DIAGNOSIS — R000 Tachycardia, unspecified: Secondary | ICD-10-CM | POA: Insufficient documentation

## 2022-06-15 DIAGNOSIS — I493 Ventricular premature depolarization: Secondary | ICD-10-CM | POA: Insufficient documentation

## 2022-06-15 NOTE — Procedures (Signed)
SLEEP STUDY REPORT Patient Information Study Date: 06/07/2022 Patient Name: Olivia Shaw Patient ID: [id number] Birth Date: 12/21/1970 Age: 52 Gender: Female BMI: 20.6 (W=123 lb, H=5' 5'') Referring Physician: Armanda Magic, MD  TEST DESCRIPTION: Home sleep apnea testing was completed using the WatchPat, a Type 1 device, utilizing peripheral arterial tonometry (PAT), chest movement, actigraphy, pulse oximetry, pulse rate, body position and snore. AHI was calculated with apnea and hypopnea using valid sleep time as the denominator. RDI includes apneas, hypopneas, and RERAs. The data acquired and the scoring of sleep and all associated events were performed in accordance with the recommended standards and specifications as outlined in the AASM Manual for the Scoring of Sleep and Associated Events 2.2.0 (2015).   FINDINGS:   1. Mild Obstructive Sleep Apnea with AHI 8.4/hr.   2. No Central Sleep Apnea with pAHIc 0.6/hr.   3. Oxygen desaturations as low as 89%.   4. Minimal snoring was present. O2 sats were < 88% for 0 min.   5. Total sleep time was 5 hrs and 7 min.   6. 25.7% of total sleep time was spent in REM sleep.   7. Normal sleep onset latency at 27 min.   8. Shortened REM sleep onset latency at 46 min.   9. Total awakenings were 5.  10. Arrhythmia detection:  None  DIAGNOSIS: Mild Obstructive Sleep Apnea (G47.33)  RECOMMENDATIONS:   1.  Clinical correlation of these findings is necessary.  The decision to treat obstructive sleep apnea (OSA) is usually based on the presence of apnea symptoms or the presence of associated medical conditions such as Hypertension, Congestive Heart Failure, Atrial Fibrillation or Obesity.  The most common symptoms of OSA are snoring, gasping for breath while sleeping, daytime sleepiness and fatigue.   2.  Initiating apnea therapy is recommended given the presence of symptoms and/or associated conditions. Recommend proceeding with one of the  following:     a.  Auto-CPAP therapy with a pressure range of 5-20cm H2O.     b.  An oral appliance (OA) that can be obtained from certain dentists with expertise in sleep medicine.  These are primarily of use in non-obese patients with mild and moderate disease.     c.  An ENT consultation which may be useful to look for specific causes of obstruction and possible treatment options.     d.  If patient is intolerant to PAP therapy, consider referral to ENT for evaluation for hypoglossal nerve stimulator.   3.  Close follow-up is necessary to ensure success with CPAP or oral appliance therapy for maximum benefit.  4.  A follow-up oximetry study on CPAP is recommended to assess the adequacy of therapy and determine the need for supplemental oxygen or the potential need for Bi-level therapy.  An arterial blood gas to determine the adequacy of baseline ventilation and oxygenation should also be considered.  5.  Healthy sleep recommendations include:  adequate nightly sleep (normal 7-9 hrs/night), avoidance of caffeine after noon and alcohol near bedtime, and maintaining a sleep environment that is cool, dark and quiet.  6.  Weight loss for overweight patients is recommended.  Even modest amounts of weight loss can significantly improve the severity of sleep apnea.  7.  Snoring recommendations include:  weight loss where appropriate, side sleeping, and avoidance of alcohol before bed.  8.  Operation of motor vehicle should be avoided when sleepy. Signature:  Armanda Magic, MD; [facility]; Diplomat, American Board of Sleep Medicine Electronically Signed:  06/15/2022 9:31:43 AM

## 2022-06-16 ENCOUNTER — Telehealth: Payer: Self-pay

## 2022-06-16 NOTE — Telephone Encounter (Signed)
-----   Message from Quintella Reichert, MD sent at 06/15/2022  9:33 AM EDT ----- Please let patient know that they have sleep apnea and recommend treating with CPAP.  Please order an auto CPAP from 4-15cm H2O with heated humidity and mask of choice.  Order overnight pulse ox on CPAP.  Followup with me in 6 weeks.

## 2022-06-16 NOTE — Telephone Encounter (Signed)
Notified patient of sleep study results and recommendations. All questions were answered (if any). Patient verbalized understanding. CPAP machine and supplies ordered through [facility] 06/16/22.

## 2022-06-17 ENCOUNTER — Telehealth: Payer: Self-pay

## 2022-06-17 DIAGNOSIS — Z79899 Other long term (current) drug therapy: Secondary | ICD-10-CM

## 2022-06-17 NOTE — Telephone Encounter (Signed)
-----   Message from Quintella Reichert, MD sent at 06/15/2022 12:00 PM EDT ----- Heart monitor showed 1 episode of a fast heartbeat from the bottom of the heart lasting 6 beats as well as occasional extra heartbeats from the top of the heart.  Please get a 2D echocardiogram.  Start Toprol-XL 25 mg daily and follow-up with extender in 4 weeks.  Please have patient come in for a bmet and magnesium level.  Encourage her to cut out all caffeine and alcohol

## 2022-06-17 NOTE — Telephone Encounter (Signed)
Called patient to discuss heart monitor results, which showed 1 episode of a fast heartbeat from the bottom of the heart lasting 6 beats as well as occasional extra heartbeats from the top of the heart.  Per Dr. Mayford Knife, patient needs echo which has already been scheduled for 07/23/22. Dr. Mayford Knife also recommends have patient come in for a bmet and magnesium level, which was scheduled for same day as Echo. Dr. Mayford Knife also requests follow-up with extender in 4 weeks, thi is scheduled for 08/06/22.  Dr. Mayford Knife advises patient to start Toprol-XL 25 mg daily, patient declines to start Toprol at this time and states she wants to wait until after Echo to disucss at appt w/ extender. Also discussed Dr. Norris Cross advice to cut out all caffeine and alcohol, patient verbalizes understanding.

## 2022-06-18 ENCOUNTER — Ambulatory Visit (HOSPITAL_COMMUNITY): Payer: BC Managed Care – PPO

## 2022-06-18 ENCOUNTER — Ambulatory Visit: Payer: BC Managed Care – PPO | Attending: Cardiology

## 2022-06-18 DIAGNOSIS — G4719 Other hypersomnia: Secondary | ICD-10-CM

## 2022-07-07 ENCOUNTER — Ambulatory Visit: Payer: BC Managed Care – PPO | Admitting: Cardiology

## 2022-07-14 ENCOUNTER — Telehealth: Payer: Self-pay | Admitting: Medical Oncology

## 2022-07-14 NOTE — Telephone Encounter (Signed)
MTG-015 - Tissue and Bodily Fluids: Translational Medicine: Discovery and Evaluation of Biomarkers/Pharmacogenomics for the Diagnosis and Personalized Management of Patients    Outgoing call: one year follow-up for completion of study.  Called patient and introduced myself, explaining that the one year study follow-up call has arrived. Inquired with patient if she had a few minutes to complete and update medical history and medications that were reported last year. Patient stated that she did. Patient informed me that she had a left mastectomy with [facility] last year, July of 2023. Patient stated that she only had seen Dr. Mosetta Putt the one time for the consult.   Medical Conditions: Patient confirms that history of hypertension had resolved in December of 2023 and denies any new medical conditions and confirms ongoing osteo arthritis.    Medications: Patient's medication list from last year and current was reviewed with patient. Per patient, she never used the Dapsone gel rx, or the Timolol maleate. Patient confirms no other new medications.   Patient was thanked for her time and contribution to the study and all questions answered to her satisfaction. Patient was informed that the study provides a $50 gift card for the completion of the study follow-up. I inquired with patient if she preferred to have it mailed to her or if she would like to pick it up. Patient also informed that if she preferred for [place] to mail it to her, we cannot replace the gift card should it get lost in the mail. Patient stated she preferred to have it mailed to her and understands the risk. Gift card will be mailed to patient on July 8th, due to holiday tomorrow.     Rexene Edison, RN, BSN, CCRC Clinical Research Nurse Lead 07/14/2022 3:36 PM

## 2022-07-23 ENCOUNTER — Ambulatory Visit (HOSPITAL_COMMUNITY): Payer: BC Managed Care – PPO | Attending: Cardiology

## 2022-07-23 DIAGNOSIS — I359 Nonrheumatic aortic valve disorder, unspecified: Secondary | ICD-10-CM | POA: Diagnosis not present

## 2022-07-23 LAB — ECHOCARDIOGRAM COMPLETE
Area-P 1/2: 3.95 cm2
S' Lateral: 2.3 cm

## 2022-07-25 ENCOUNTER — Encounter: Payer: Self-pay | Admitting: Cardiology

## 2022-07-27 ENCOUNTER — Telehealth: Payer: Self-pay | Admitting: Cardiology

## 2022-07-27 NOTE — Telephone Encounter (Signed)
 Patient is returning call to discuss echo results. °

## 2022-07-27 NOTE — Telephone Encounter (Signed)
Pt advised her Echo results and will keep her OV 08/09/22.

## 2022-08-06 ENCOUNTER — Encounter: Payer: Self-pay | Admitting: *Deleted

## 2022-08-06 ENCOUNTER — Encounter: Payer: Self-pay | Admitting: Physician Assistant

## 2022-08-06 ENCOUNTER — Ambulatory Visit: Payer: BC Managed Care – PPO | Attending: Physician Assistant | Admitting: Physician Assistant

## 2022-08-06 VITALS — BP 128/78 | HR 102 | Ht 65.0 in | Wt 124.0 lb

## 2022-08-06 DIAGNOSIS — Z79899 Other long term (current) drug therapy: Secondary | ICD-10-CM

## 2022-08-06 DIAGNOSIS — R002 Palpitations: Secondary | ICD-10-CM

## 2022-08-06 DIAGNOSIS — I10 Essential (primary) hypertension: Secondary | ICD-10-CM

## 2022-08-06 DIAGNOSIS — R011 Cardiac murmur, unspecified: Secondary | ICD-10-CM

## 2022-08-06 DIAGNOSIS — G4719 Other hypersomnia: Secondary | ICD-10-CM | POA: Diagnosis not present

## 2022-08-06 NOTE — Progress Notes (Signed)
Cardiology Office Note:  .   Date:  08/06/2022  ID:  Olivia Shaw, DOB [birth year], MRN [id number] PCP: Ralene Ok, MD  [facility] Providers Cardiologist:  Armanda Magic, MD {    History of Present Illness: .   Olivia Shaw is a 52 y.o. female with a history of anxiety, breast cancer, endometriosis who was originally referred for palpitations.  Here for follow-up appointment.  Was seen recently by Dr. Mayford Knife May 19, 2022 and endorsed palpitations for the last 20 years.  She would wake up in the melanite with palpitations and could not get back to sleep.  They become more frequent recently and now 2-3 times weekly and only happening at night.  No daytime palpitation.  When her heart racing she also gets tightness across her chest.   Hot when she gets it the severity will wax and wane for about an hour and then resolved.  Feels tired when she wakes up in sleep throughout the day because she has not slept well.  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  Had an echo at [facility] in 2017 but unfortunately we cannot access the results.  Denied chest pain at the time of her appointment but did have some problems with DOE surrounding anemia (being worked up by PCP).  No lower extremity edema or syncope except when she had endometriosis.  Today, she tells me that she has been dealing with vibrations in her chest since 2003.  Back then she had a very severe flu infection.  They have been happening on and off since then however, over the last 6 months or so has become more frequent and more severe.  Typically happen at night where she will wake up from her sleep and sometimes also is accompanying with a sympathetic response.  We have done lab right cardiac workup involving echocardiogram, long-term monitor, and cardiac calcium scoring without clear culprit.  We have ordered the lab work today including BMP, CBC, TSH, and magnesium.  She states she had a vasectomy a year ago and her  hemoglobin dropped to 7.2.  Since then she has always been slightly anemic.  Does fatigue easily.  She informed me of a long COVID study done at [facility] and request referral.  Since we have not found the culprit from a cardiac standpoint, I think this would be worth investigating.  Reports no shortness of breath nor dyspnea on exertion. Reports no chest pain, pressure, or tightness. No edema, orthopnea, PND.    ROS: Pertinent ROS in HPI  Studies Reviewed: .       Cardiac monitor 05/19/2022  Predominant rhythm was normal sinus rhythm with heart rate 83 bpm range 54 to 139 bpm.   1 episode of wide-complex tachycardia lasting 6 beats with a maximum heart rate of 220 to per minute   PACs, atrial couplets, atrial triplets   Rare PVCs and ventricular   Most the patient's symptoms did not correlate with any arrhythmia  Echocardiogram 07/23/2022 IMPRESSIONS     1. Left ventricular ejection fraction, by estimation, is 55 to 60%. The  left ventricle has normal function. The left ventricle has no regional  wall motion abnormalities. Left ventricular diastolic parameters are  consistent with Grade I diastolic  dysfunction (impaired relaxation). The average left ventricular global  longitudinal strain is -23.6 %. The global longitudinal strain is normal.   2. Right ventricular systolic function is normal. The right ventricular  size is normal. There is normal pulmonary artery  systolic pressure. The  estimated right ventricular systolic pressure is 35.9 mmHg.   3. Mild bileaflet mitral valve prolapse. The mitral valve is abnormal.  Trivial mitral valve regurgitation. No evidence of mitral stenosis.   4. The aortic valve is tricuspid. Aortic valve regurgitation is not  visualized. No aortic stenosis is present.   5. The inferior vena cava is normal in size with greater than 50%  respiratory variability, suggesting right atrial pressure of 3 mmHg.   FINDINGS   Left Ventricle: Left ventricular ejection  fraction, by estimation, is 55  to 60%. The left ventricle has normal function. The left ventricle has no  regional wall motion abnormalities. The average left ventricular global  longitudinal strain is -23.6 %.  The global longitudinal strain is normal. The left ventricular internal  cavity size was normal in size. There is no left ventricular hypertrophy.  Left ventricular diastolic parameters are consistent with Grade I  diastolic dysfunction (impaired  relaxation).   Right Ventricle: The right ventricular size is normal. No increase in  right ventricular wall thickness. Right ventricular systolic function is  normal. There is normal pulmonary artery systolic pressure. The tricuspid  regurgitant velocity is 2.87 m/s, and   with an assumed right atrial pressure of 3 mmHg, the estimated right  ventricular systolic pressure is 35.9 mmHg.   Left Atrium: Left atrial size was normal in size.   Right Atrium: Right atrial size was normal in size.   Pericardium: There is no evidence of pericardial effusion.   Mitral Valve: Mild bileaflet mitral valve prolapse. The mitral valve is  abnormal. Trivial mitral valve regurgitation. No evidence of mitral valve  stenosis.   Tricuspid Valve: The tricuspid valve is normal in structure. Tricuspid  valve regurgitation is mild.   Aortic Valve: The aortic valve is tricuspid. Aortic valve regurgitation is  not visualized. No aortic stenosis is present.   Pulmonic Valve: The pulmonic valve was normal in structure. Pulmonic valve  regurgitation is not visualized.   Aorta: The aortic root is normal in size and structure.   Venous: The inferior vena cava is normal in size with greater than 50%  respiratory variability, suggesting right atrial pressure of 3 mmHg.   IAS/Shunts: No atrial level shunt detected by color flow Doppler.    STOP-Bang Score:  2      Physical Exam:   VS:  There were no vitals taken for this visit.   Wt Readings from Last  3 Encounters:  05/19/22 124 lb 3.2 oz (56.3 kg)  06/24/21 121 lb 3.2 oz (55 kg)  12/12/20 125 lb 9.6 oz (57 kg)    GEN: Well nourished, well developed in no acute distress NECK: No JVD; No carotid bruits CARDIAC: RRR, no murmurs, rubs, gallops RESPIRATORY:  Clear to auscultation without rales, wheezing or rhonchi  ABDOMEN: Soft, non-tender, non-distended EXTREMITIES:  No edema; No deformity   ASSESSMENT AND PLAN: .    Palpitations/Vibrations -magnesium, BMP, TSH, CBC ordered today -she has noticed them every night, purring/vibration in her chest, [facility] long COVID referral sent today. Started when she had the flu back in 2003. Christmas COVID three weeks and vibration have picked up since then -her resting HR is in the 70s usually, will hold off on metoprolol for now  2.  Primary hypertension -Today, well-controlled 128/78 -Continue current medication regimen  3.  MVP/heart murmur -Recent echocardiogram reviewed with patient -This is a chronic diagnosis, asymptomatic  4.  Mild OSA -no machine, haven't heard from  them for over a month -I will reach out to [facility] to trouble shoot.  [phone] number for [facility] long COVID study    Dispo: She can follow-up in 6 months with Dr. Mayford Knife or myself  Signed, Sharlene Dory, PA-C

## 2022-08-06 NOTE — Patient Instructions (Signed)
Medication Instructions:  No changes *If you need a refill on your cardiac medications before your next appointment, please call your pharmacy*   Lab Work: Today: bmet, cbc, tsh, magnesium  If you have labs (blood work) drawn today and your tests are completely normal, you will receive your results only by: MyChart Message (if you have MyChart) OR A paper copy in the mail If you have any lab test that is abnormal or we need to change your treatment, we will call you to review the results.   Testing/Procedures: none   Follow-Up: At [facility], you and your health needs are our priority.  As part of our continuing mission to provide you with exceptional heart care, we have created designated Provider Care Teams.  These Care Teams include your primary Cardiologist (physician) and Advanced Practice Providers (APPs -  Physician Assistants and Nurse Practitioners) who all work together to provide you with the care you need, when you need it.   Your next appointment:   6 month(s)  Provider:   Jari Favre, PA-C  OR  Armanda Magic, MD

## 2022-09-08 DIAGNOSIS — R490 Dysphonia: Secondary | ICD-10-CM | POA: Insufficient documentation

## 2022-09-10 ENCOUNTER — Other Ambulatory Visit: Payer: Self-pay | Admitting: *Deleted

## 2022-11-16 ENCOUNTER — Encounter: Payer: Self-pay | Admitting: Internal Medicine

## 2022-12-10 ENCOUNTER — Ambulatory Visit
Admission: EM | Admit: 2022-12-10 | Discharge: 2022-12-10 | Disposition: A | Discharge status: Home / Self Care | Payer: BC Managed Care – PPO | Attending: Internal Medicine | Admitting: Internal Medicine

## 2022-12-10 DIAGNOSIS — J0100 Acute maxillary sinusitis, unspecified: Secondary | ICD-10-CM

## 2022-12-10 DIAGNOSIS — J209 Acute bronchitis, unspecified: Secondary | ICD-10-CM | POA: Diagnosis not present

## 2022-12-10 MED ORDER — ALBUTEROL SULFATE HFA 108 (90 BASE) MCG/ACT IN AERS: 2.0000 | INHALATION_SPRAY | Freq: Four times a day (QID) | RESPIRATORY_TRACT | 0 refills | Status: AC | PRN

## 2022-12-10 MED ORDER — AMOXICILLIN-POT CLAVULANATE 875-125 MG PO TABS: 1.0000 | ORAL_TABLET | Freq: Two times a day (BID) | ORAL | 0 refills | Status: AC

## 2022-12-10 MED ORDER — PROMETHAZINE-DM 6.25-15 MG/5ML PO SYRP: 5.0000 mL | ORAL_SOLUTION | Freq: Four times a day (QID) | ORAL | 0 refills | Status: AC | PRN

## 2022-12-10 NOTE — ED Triage Notes (Signed)
Pt with c/o cough and congestion and losing her voice. States her ear feel full. Pt had covid about 3 weeks ago. States she now intermittently feels dizzy. Called her PCP but they are out of office for the week.

## 2022-12-10 NOTE — Discharge Instructions (Signed)
A prescription was sent for Augmentin. This is an antibiotic used to treat upper respiratory infections. Take as directed.   I have also sent an inhaler to your pharmacy as well as a cough medicine. Take the cough medicine at night because it can make you sleepy.  Return in 3 to 4 days if no improvement. It is very important for you to pay attention to any new symptoms or worsening of your current condition. Please go directly to the Emergency Department immediately should you begin to have any of the following symptoms: shortness of breath, chest pain or difficulty breathing.
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
## 2022-12-30 DIAGNOSIS — H5340 Unspecified visual field defects: Secondary | ICD-10-CM | POA: Insufficient documentation

## 2022-12-30 DIAGNOSIS — D169 Benign neoplasm of bone and articular cartilage, unspecified: Secondary | ICD-10-CM | POA: Insufficient documentation

## 2023-01-27 DIAGNOSIS — R102 Pelvic and perineal pain unspecified side: Secondary | ICD-10-CM | POA: Insufficient documentation

## 2023-03-14 DIAGNOSIS — M79672 Pain in left foot: Secondary | ICD-10-CM | POA: Insufficient documentation

## 2023-03-15 ENCOUNTER — Other Ambulatory Visit: Payer: Self-pay | Admitting: Orthopedic Surgery

## 2023-03-17 ENCOUNTER — Ambulatory Visit (HOSPITAL_BASED_OUTPATIENT_CLINIC_OR_DEPARTMENT_OTHER): Admission: RE | Admit: 2023-03-17 | Source: Home / Self Care | Admitting: Orthopedic Surgery

## 2023-03-17 ENCOUNTER — Encounter (HOSPITAL_BASED_OUTPATIENT_CLINIC_OR_DEPARTMENT_OTHER): Admission: RE | Payer: Self-pay | Source: Home / Self Care

## 2023-03-17 SURGERY — REMOVAL, HARDWARE
Anesthesia: Monitor Anesthesia Care | Laterality: Right

## 2023-05-13 DIAGNOSIS — U099 Post covid-19 condition, unspecified: Secondary | ICD-10-CM | POA: Insufficient documentation

## 2023-05-13 DIAGNOSIS — F419 Anxiety disorder, unspecified: Secondary | ICD-10-CM | POA: Insufficient documentation

## 2023-05-24 DIAGNOSIS — C55 Malignant neoplasm of uterus, part unspecified: Secondary | ICD-10-CM | POA: Insufficient documentation

## 2023-05-24 HISTORY — PX: OTHER SURGICAL HISTORY: SHX169

## 2023-11-02 ENCOUNTER — Encounter: Payer: Self-pay | Admitting: Nurse Practitioner

## 2023-11-26 LAB — LAB REPORT - SCANNED
EGFR: 103
TSH: 1 (ref 0.41–5.90)

## 2023-11-29 DIAGNOSIS — L304 Erythema intertrigo: Secondary | ICD-10-CM | POA: Insufficient documentation

## 2023-11-29 DIAGNOSIS — D2261 Melanocytic nevi of right upper limb, including shoulder: Secondary | ICD-10-CM | POA: Insufficient documentation

## 2023-11-29 DIAGNOSIS — D225 Melanocytic nevi of trunk: Secondary | ICD-10-CM | POA: Insufficient documentation

## 2023-11-29 DIAGNOSIS — L719 Rosacea, unspecified: Secondary | ICD-10-CM | POA: Insufficient documentation

## 2023-12-01 ENCOUNTER — Ambulatory Visit: Admitting: Neurology

## 2023-12-20 ENCOUNTER — Encounter: Payer: Self-pay | Admitting: Cardiology

## 2023-12-21 ENCOUNTER — Ambulatory Visit: Admitting: Nurse Practitioner

## 2023-12-21 ENCOUNTER — Other Ambulatory Visit: Payer: Self-pay

## 2023-12-21 VITALS — BP 112/72 | HR 78 | Ht 65.0 in | Wt 121.1 lb

## 2023-12-21 DIAGNOSIS — R197 Diarrhea, unspecified: Secondary | ICD-10-CM | POA: Diagnosis not present

## 2023-12-21 DIAGNOSIS — K625 Hemorrhage of anus and rectum: Secondary | ICD-10-CM | POA: Diagnosis not present

## 2023-12-21 DIAGNOSIS — R194 Change in bowel habit: Secondary | ICD-10-CM

## 2023-12-21 MED ORDER — NA SULFATE-K SULFATE-MG SULF 17.5-3.13-1.6 GM/177ML PO SOLN: 1.0000 | Freq: Once | ORAL | 0 refills | Status: AC

## 2023-12-21 NOTE — Patient Instructions (Signed)
 _______________________________________________________  If your blood pressure at your visit was 140/90 or greater, please contact your primary care physician to follow up on this.  _______________________________________________________  If you are age 55 or older, your body mass index should be between 23-30. Your Body mass index is 20.16 kg/m. If this is out of the aforementioned range listed, please consider follow up with your Primary Care Provider.  If you are age 40 or younger, your body mass index should be between 19-25. Your Body mass index is 20.16 kg/m. If this is out of the aformentioned range listed, please consider follow up with your Primary Care Provider.   ________________________________________________________  The [facility] providers would like to encourage you to use MYCHART to communicate with providers for non-urgent requests or questions.  Due to long hold times on the telephone, sending your provider a message by [facility] may be a faster and more efficient way to get a response.  Please allow 48 business hours for a response.  Please remember that this is for non-urgent requests.  _______________________________________________________  [facility] is using a team-based approach to care.  Your team is made up of your doctor and two to three APPS. Our APPS (Nurse Practitioners and Physician Assistants) work with your physician to ensure care continuity for you. They are fully qualified to address your health concerns and develop a treatment plan. They communicate directly with your gastroenterologist to care for you. Seeing the Advanced Practice Practitioners on your physician's team can help you by facilitating care more promptly, often allowing for earlier appointments, access to diagnostic testing, procedures, and other specialty referrals.   We have sent the following medications to your pharmacy for you to pick up at your convenience: Suprep  Please  purchase the following medications over the counter and take as directed: Benefiber 1 tablespoon daily as tolerated   Avoid daily products or take 2 lactacid with each daily product  You have been scheduled for a colonoscopy. Please follow written instructions given to you at your visit today.   If you use inhalers (even only as needed), please bring them with you on the day of your procedure.  DO NOT TAKE 7 DAYS PRIOR TO TEST- Trulicity (dulaglutide) Ozempic, Wegovy (semaglutide) Mounjaro, Zepbound (tirzepatide) Bydureon Bcise (exanatide extended release)  DO NOT TAKE 1 DAY PRIOR TO YOUR TEST Rybelsus (semaglutide) Adlyxin (lixisenatide) Victoza (liraglutide) Byetta (exanatide) ___________________________________________________________________________  Thank you for trusting me with your gastrointestinal care!   Elida Shawl, CRNP
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
## 2023-12-23 ENCOUNTER — Encounter: Payer: Self-pay | Admitting: Nurse Practitioner

## 2023-12-23 ENCOUNTER — Encounter: Payer: Self-pay | Admitting: Gastroenterology

## 2023-12-26 ENCOUNTER — Encounter: Payer: Self-pay | Admitting: Gastroenterology

## 2023-12-29 ENCOUNTER — Telehealth: Payer: Self-pay

## 2023-12-29 ENCOUNTER — Encounter: Payer: Self-pay | Admitting: Cardiology

## 2023-12-29 NOTE — Telephone Encounter (Signed)
 [facility] Pre-operative Risk Assessment     Request for surgical clearance:      Procedure  What type of surgery is being performed?     colonoscopy  When is this surgery scheduled?     No scheduled yet  What type of clearance is required ?   Cardiac  Are there any medications that need to be held prior to surgery and how long? no  Practice name and name of physician performing surgery?      [facility]  What is your office phone and fax number?      Phone- [phone]  Fax- [phone]  Anesthesia type (None, local, MAC, general) ?       MAC   Please route your response to Rock Mania RN

## 2023-12-29 NOTE — Telephone Encounter (Signed)
 Per preop APP pt has appt 02/17/24 with Dr. Shlomo, appts notes noted for preop clearance needed per preop APP Barnie Hila, NP

## 2023-12-29 NOTE — Telephone Encounter (Signed)
 Note sent to cardiology requesting cardiac clearance as requested.

## 2023-12-29 NOTE — Telephone Encounter (Signed)
° °  Name: Olivia Shaw  DOB: Dec 13, 1970  MRN: [id number]  Primary Cardiologist: Wilbert Bihari, MD  Chart reviewed as part of pre-operative protocol coverage. Because of [name]'s past medical history and time since last visit, she will require a follow-up in-office visit in order to better assess preoperative cardiovascular risk.  Patient has an office visit scheduled on 02/17/2024 with Dr. Bihari. Appointment notes have been updated to reflect need for pre-op evaluation.   Pre-op covering staff:  - Please contact requesting surgeon's office via preferred method (i.e, phone, fax) to inform them of need for appointment prior to surgery.  Barnie Hila, NP  12/29/2023, 3:43 PM

## 2023-12-30 ENCOUNTER — Encounter: Admitting: Gastroenterology

## 2023-12-30 ENCOUNTER — Telehealth: Payer: Self-pay

## 2023-12-30 NOTE — Telephone Encounter (Signed)
 Call to patient, no answer, left detailed message advising of DOD appt booked for 01/03/24.

## 2023-12-30 NOTE — Telephone Encounter (Signed)
 Call to patient to ask if she experiences palpitations with elevated HR, patient endorses that she does and that she usually has to rest for 10 mins to an hour to get them them stop.

## 2023-12-30 NOTE — Telephone Encounter (Signed)
 Also discussed with patient what symptoms she is wanting referral to [facility] clinic for. Patient reports she has chest tightness/palpitations with episodes of increased HR. She also reports shaking from too much adrenaline.

## 2023-12-30 NOTE — Telephone Encounter (Signed)
-----   Message from Wilbert Bihari, MD sent at 12/22/2023  8:19 PM EST ----- Is she having palpitations or does she just notice HR increase on her heart mointor ----- Message ----- From: Janit Geni CROME, RN Sent: 12/22/2023  10:56 AM EST To: Wilbert JONELLE Bihari, MD  Here are heart monitor results that patient had done with outside MD. She is the patient who was asking about [place] [facility] clinic referral. Geni

## 2024-01-01 NOTE — Progress Notes (Signed)
 Agree with the assessment and plan as outlined by Alcide Evener, NP.    Zae Kirtz E. Tomasa Rand, MD [facility]

## 2024-01-01 NOTE — Progress Notes (Signed)
 " Cardiology Office Note:  .   Date:  01/03/2024  ID:  Olivia Shaw, DOB Jun 02, 1970, MRN [id number] PCP: Valma Carwin, MD  [facility] Providers Cardiologist:  Wilbert Bihari, MD    History of Present Illness: .   Olivia Shaw is a 53 y.o. female.  Discussed the use of AI scribe software for clinical note transcription with the patient, who gave verbal consent to proceed.  Atrial tachycardia on recent monitor. Chest pain and palpitations. Pt of Dr. Bihari  History of Present Illness Olivia Shaw is a 53 year old female with mitral valve prolapse who presents with palpitations and chest discomfort. 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  She describes long-standing internal chest vibrations since a flu illness in 2023, initially about three times per year, now increased to once or twice weekly after COVID-19. Episodes often wake her from sleep with heart rate up to 133 bpm, chest constriction, and shortness of breath that she describes as feeling squeezed.  Over the past six months she has called an ambulance twice for rapid heart rate, with rates up to 155 bpm while sitting. Paramedics documented normal sinus rhythm during these events, consistent with readings she records on her phone. She recently wore a heart monitor and had symptoms during monitoring, and she understands that results were sent to her physician.  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 She is not taking medication for hypertension or palpitations. Symptoms have led her to pull over while driving and have limited her ability to work.  She lives alone and is worried about safety and maintaining independence given these episodes. 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 She has anxiety and has previously been told these symptoms were anxiety related, but she emphasizes  that many episodes occur during sleep. She denies snoring or known sleep apnea.    ROS: negative except per HPI above.  Studies Reviewed: SABRA   EKG Interpretation Date/Time:  Tuesday January 03 2024 15:02:52 EST Ventricular Rate:  85 PR Interval:  106 QRS Duration:  82 QT Interval:  348 QTC Calculation: 414 R Axis:   53  Text Interpretation: Sinus rhythm with short PR When compared with ECG of 30-Sep-2020 09:49, T wave amplitude has increased in Anterior leads Confirmed by Loni Rushing ([id number]) on 01/03/2024 3:12:48 PM    Results Diagnostic Echocardiogram (2024): Mild bileaflet mitral valve prolapse with trivial mitral regurgitation. No evidence of mitral annular disjunction. Holter monitor (12/2023): Eighteen episodes of supraventricular tachycardia. Fastest rate 158 beats per minute. Longest episode 13 beats at 137 beats per minute. Episodes associated with symptoms. Risk Assessment/Calculations:       Physical Exam:   VS:  BP 130/76 (BP Location: Right Arm, Patient Position: Sitting, Cuff Size: Normal)   Pulse 80   Ht 5' 5 (1.651 m)   Wt 121 lb (54.9 kg)   SpO2 99%   BMI 20.14 kg/m    Wt Readings from Last 3 Encounters:  01/03/24 121 lb (54.9 kg)  12/21/23 121 lb 2 oz (54.9 kg)  08/06/22 124 lb (56.2 kg)     Physical Exam GENERAL: Alert, cooperative, well developed, no acute distress. HEENT: Normocephalic, normal oropharynx, moist mucous membranes. CHEST: Clear to auscultation bilaterally, no wheezes, rhonchi, or crackles. CARDIOVASCULAR: Normal heart rate and rhythm, S1 and S2 normal without murmurs. ABDOMEN: Soft, non-tender, non-distended, without organomegaly, normal bowel  sounds. EXTREMITIES: No cyanosis or edema. NEUROLOGICAL: Cranial nerves grossly intact, moves all extremities without gross motor or sensory deficit.   ASSESSMENT AND PLAN: .    Assessment and Plan Assessment & Plan Supraventricular tachycardia Recurrent SVT with heart rates up to  158 bpm, confirmed by Holter monitor. No structural heart abnormalities. Differential includes mitral valve prolapse and post-viral effects. SVT symptomatic but not life-threatening. Discussed metoprolol  and potential electrophysiology referral. - Prescribed metoprolol  12.5 mg as needed, up to twice daily, for symptomatic episodes. - Referred to electrophysiologist for evaluation and potential ablation per patient request. - Ordered coronary CT scan to rule out significant coronary artery disease. - Ordered CT scan of the aorta to evaluate reported vascular ring adjacent to esophagus as per patient recollection. No imaging to confirm.  Mitral valve prolapse with trivial mitral regurgitation Mild biventricular mitral valve prolapse with trivial mitral regurgitation. No mitral annular disjunction seen on echo. Potential contributor to SVT symptoms. No immediate intervention required.  ?Vascular ring of aorta per patient report - with no current symptoms but some swallowing difficulties. Further imaging required. - Ordered CT scan of the aorta to evaluate double aortic arch.  Recording duration: 29 minutes  I spent 40 minutes in the care of [name] today including reviewing labs (bmet/cbc/tsh), reviewing studies (echo in room with patient, monitor), face to face time discussing treatment options (29 mins), and documenting in the encounter.      Soyla Merck, MD, [facility] "

## 2024-01-03 ENCOUNTER — Ambulatory Visit: Attending: Internal Medicine | Admitting: Internal Medicine

## 2024-01-03 VITALS — BP 130/76 | HR 80 | Ht 65.0 in | Wt 121.0 lb

## 2024-01-03 DIAGNOSIS — R002 Palpitations: Secondary | ICD-10-CM | POA: Diagnosis not present

## 2024-01-03 DIAGNOSIS — Q254 Congenital malformation of aorta unspecified: Secondary | ICD-10-CM

## 2024-01-03 DIAGNOSIS — Q2545 Double aortic arch: Secondary | ICD-10-CM | POA: Diagnosis not present

## 2024-01-03 DIAGNOSIS — R079 Chest pain, unspecified: Secondary | ICD-10-CM | POA: Diagnosis not present

## 2024-01-03 DIAGNOSIS — I341 Nonrheumatic mitral (valve) prolapse: Secondary | ICD-10-CM

## 2024-01-03 DIAGNOSIS — Z79899 Other long term (current) drug therapy: Secondary | ICD-10-CM

## 2024-01-03 DIAGNOSIS — I471 Supraventricular tachycardia, unspecified: Secondary | ICD-10-CM | POA: Diagnosis not present

## 2024-01-03 MED ORDER — METOPROLOL TARTRATE 25 MG PO TABS: 12.5000 mg | ORAL_TABLET | Freq: Two times a day (BID) | ORAL | 3 refills | Status: AC | PRN

## 2024-01-03 NOTE — Patient Instructions (Addendum)
 Medication Instructions:  Start: Metoprolol  Tartrate (Lopressor ) 12.5 mg (half a tablet) two times day AS NEEDED for Palpitations and/or SVT  Lab Work: None  Testing/Procedures: Non-Cardiac CT Angiography (CTA), is a special type of CT scan that uses a computer to produce multi-dimensional views of major blood vessels throughout the body. In CT angiography, a contrast material is injected through an IV to help visualize the blood vessels   Your cardiac CT will be scheduled at the below location:    [facility] [address] [place], [place] [address] [phone]   If scheduled at [facility], please arrive 30 minutes early for check-in and test prep.  Please follow these instructions carefully (unless otherwise directed):  An IV will be required for this test and Nitroglycerin will be given.   On the Night Before the Test: Be sure to Drink plenty of water. Do not consume any caffeinated/decaffeinated beverages or chocolate 12 hours prior to your test. Do not take any antihistamines 12 hours prior to your test.  On the Day of the Test: Drink plenty of water until 1 hour prior to the test. Do not eat any food 1 hour prior to test. You may take your regular medications prior to the test.  Take metoprolol  (Lopressor ) 12.5 mg two hours prior to test. If you take Furosemide/Hydrochlorothiazide/Spironolactone/Chlorthalidone, please HOLD on the morning of the test. Patients who wear a continuous glucose monitor MUST remove the device prior to scanning. FEMALES- please wear underwire-free bra if available, avoid dresses & tight clothing   After the Test: Drink plenty of water. After receiving IV contrast, you may experience a mild flushed feeling. This is normal. On occasion, you may experience a mild rash up to 24 hours after the test. This is not dangerous. If this occurs, you can take Benadryl 25 mg, Zyrtec, Claritin, or Allegra and increase your fluid  intake. (Patients taking Tikosyn should avoid Benadryl, and may take Zyrtec, Claritin, or Allegra) If you experience trouble breathing, this can be serious. If it is severe call [phone] IMMEDIATELY. If it is mild, please call our office.  We will call to schedule your test 2-4 weeks out understanding that some insurance companies will need an authorization prior to the service being performed.   For more information and frequently asked questions, please visit our website : [email or web address]  For non-scheduling related questions, please contact the cardiac imaging nurse navigator should you have any questions/concerns: Cardiac Imaging Nurse Navigators Direct Office Dial: [phone]   For scheduling needs, including cancellations and rescheduling, please call Brittany, [phone].    Follow-Up: At [facility], you and your health needs are our priority.  As part of our continuing mission to provide you with exceptional heart care, our providers are all part of one team.  This team includes your primary Cardiologist (physician) and Advanced Practice Providers or APPs (Physician Assistants and Nurse Practitioners) who all work together to provide you with the care you need, when you need it.  Your next appointment:    02/17/24 at 8:00 am  Provider:   Wilbert Bihari, MD

## 2024-01-03 NOTE — Progress Notes (Signed)
 See note from Dr. Shlomo

## 2024-01-06 ENCOUNTER — Encounter (HOSPITAL_COMMUNITY): Payer: Self-pay

## 2024-01-09 ENCOUNTER — Encounter: Payer: Self-pay | Admitting: Internal Medicine

## 2024-01-10 ENCOUNTER — Ambulatory Visit (HOSPITAL_BASED_OUTPATIENT_CLINIC_OR_DEPARTMENT_OTHER)
Admission: RE | Admit: 2024-01-10 | Discharge: 2024-01-10 | Disposition: A | Discharge status: Home / Self Care | Source: Ambulatory Visit | Attending: Internal Medicine | Admitting: Internal Medicine

## 2024-01-10 ENCOUNTER — Ambulatory Visit: Payer: Self-pay | Admitting: Internal Medicine

## 2024-01-10 DIAGNOSIS — R079 Chest pain, unspecified: Secondary | ICD-10-CM | POA: Insufficient documentation

## 2024-01-10 DIAGNOSIS — Q2545 Double aortic arch: Secondary | ICD-10-CM | POA: Insufficient documentation

## 2024-01-10 MED ORDER — IOHEXOL 350 MG/ML SOLN
100.0000 mL | Freq: Once | INTRAVENOUS | Status: AC | PRN
  Administered 2024-01-10: 95 mL via INTRAVENOUS

## 2024-01-10 MED ORDER — NITROGLYCERIN 0.4 MG SL SUBL
0.8000 mg | SUBLINGUAL_TABLET | Freq: Once | SUBLINGUAL | Status: AC
  Administered 2024-01-10: 0.8 mg via SUBLINGUAL

## 2024-01-10 MED ORDER — METOPROLOL TARTRATE 5 MG/5ML IV SOLN
5.0000 mg | Freq: Once | INTRAVENOUS | Status: AC
  Administered 2024-01-10: 5 mg via INTRAVENOUS

## 2024-01-17 ENCOUNTER — Encounter: Payer: Self-pay | Admitting: Internal Medicine

## 2024-01-20 ENCOUNTER — Other Ambulatory Visit: Payer: Self-pay

## 2024-01-20 DIAGNOSIS — K769 Liver disease, unspecified: Secondary | ICD-10-CM

## 2024-01-23 ENCOUNTER — Telehealth: Payer: Self-pay | Admitting: Neurology

## 2024-01-23 NOTE — Telephone Encounter (Signed)
 Appointment not needed.

## 2024-01-23 NOTE — Telephone Encounter (Signed)
 Reviewed CTA chest results, patient verbalizes understanding and agrees to MRI. Orders placed.

## 2024-01-29 ENCOUNTER — Encounter: Payer: Self-pay | Admitting: Internal Medicine

## 2024-02-01 ENCOUNTER — Ambulatory Visit (HOSPITAL_COMMUNITY)

## 2024-02-03 ENCOUNTER — Telehealth: Payer: Self-pay | Admitting: Gastroenterology

## 2024-02-03 NOTE — Telephone Encounter (Signed)
 Good afternoon Dr. Stacia,    I received a call from this patient stating that she would like to transfer her care to a female provider due to her feeling more comfortable with a female. Would you please advise.    Thank you.

## 2024-02-08 NOTE — Telephone Encounter (Signed)
 Good afternoon Dr. Federico and Dr. Suzann,   Will you please advise on the information below.    Thank you.

## 2024-02-09 ENCOUNTER — Ambulatory Visit: Admitting: Neurology

## 2024-02-17 ENCOUNTER — Ambulatory Visit: Admitting: Cardiology

## 2024-02-20 ENCOUNTER — Ambulatory Visit: Admitting: Gastroenterology

## 2024-02-20 ENCOUNTER — Ambulatory Visit: Admitting: Cardiovascular Disease

## 2024-02-24 ENCOUNTER — Ambulatory Visit: Admitting: Physician Assistant

## 2024-03-28 ENCOUNTER — Ambulatory Visit: Admitting: Gastroenterology

## 2024-03-28 ENCOUNTER — Ambulatory Visit: Admitting: Pediatrics

## 2024-05-05 IMAGING — MG MM DIGITAL SCREENING BILAT W/ TOMO AND CAD
6 of 10 series · 6 of 30 positions shown · non-contrast
Comparison: Previous exam(s).

CLINICAL DATA: Screening.

EXAM:
DIGITAL SCREENING BILATERAL MAMMOGRAM WITH TOMOSYNTHESIS AND CAD
TECHNIQUE: Bilateral screening digital craniocaudal and mediolateral oblique
mammograms were obtained. Bilateral screening digital breast
tomosynthesis was performed. The images were evaluated with
computer-aided detection.

[R CC synth-2D]
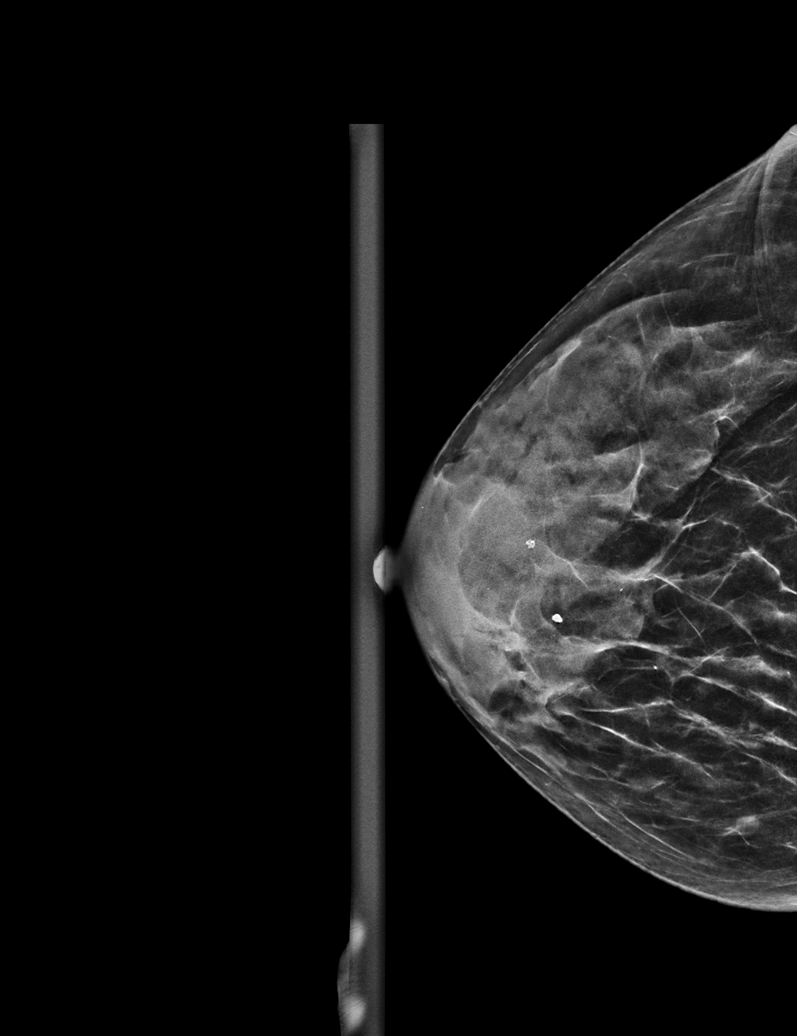

[L MLO synth-2D (1 of 2)]
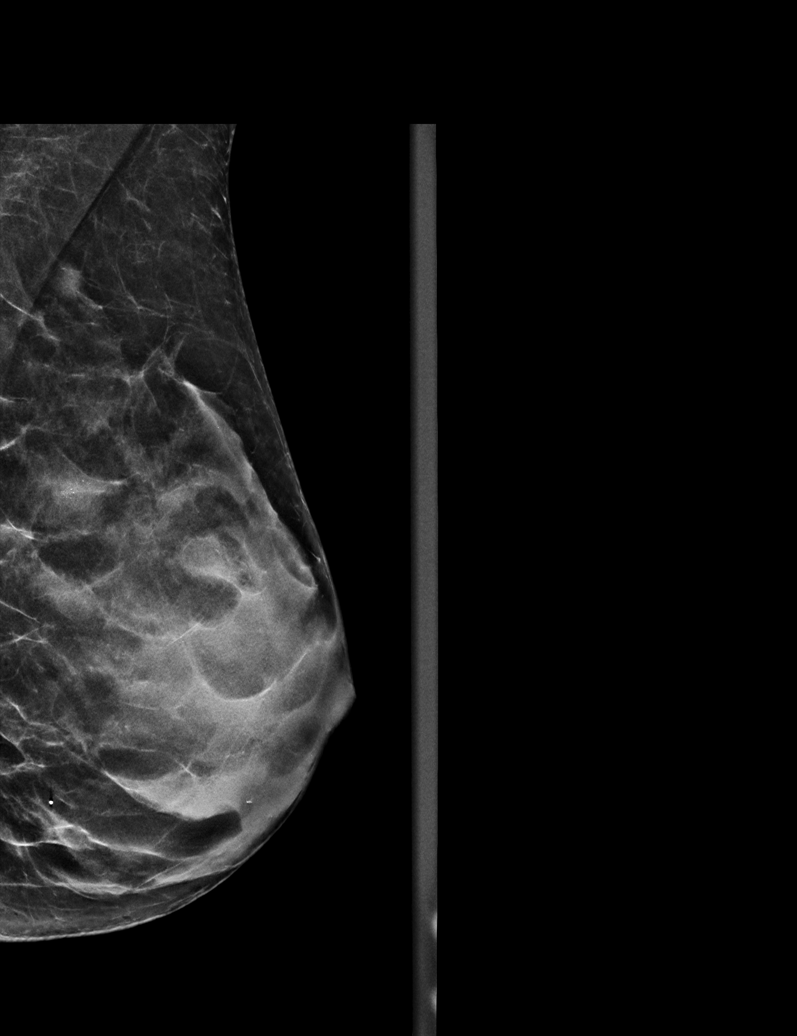

[R MLO synth-2D]
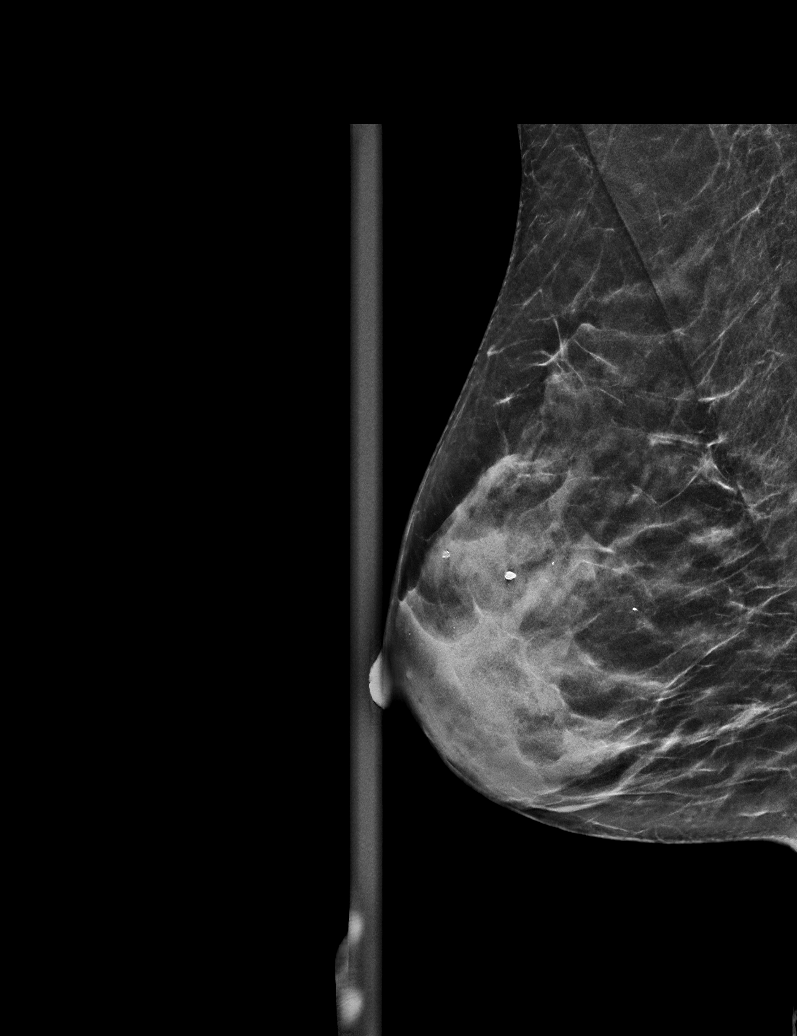

[L CC synth-2D]
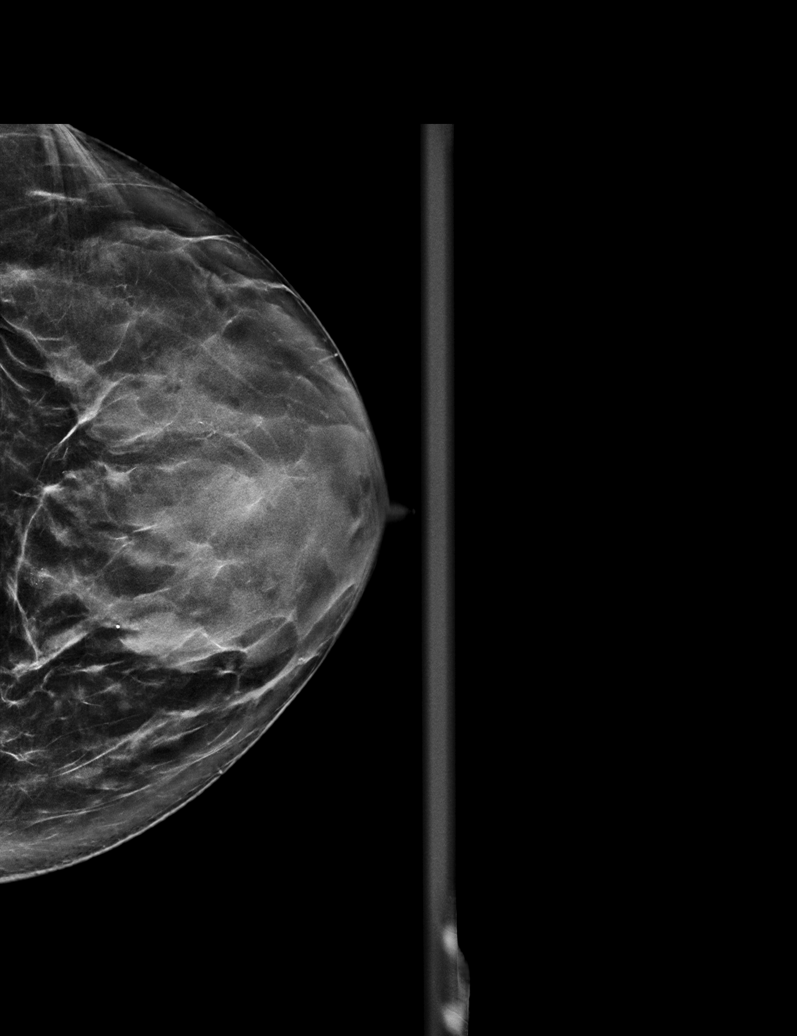

[L MLO synth-2D (2 of 2)]
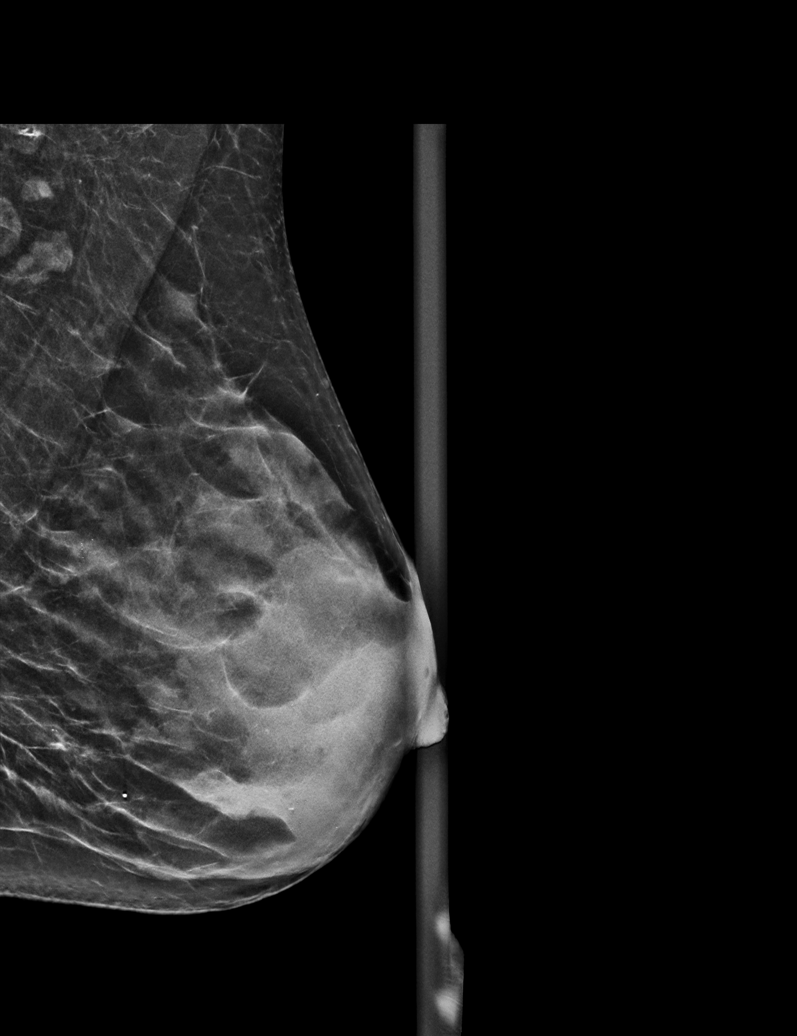

[R CC tomo · tomo slice 25/49.0]
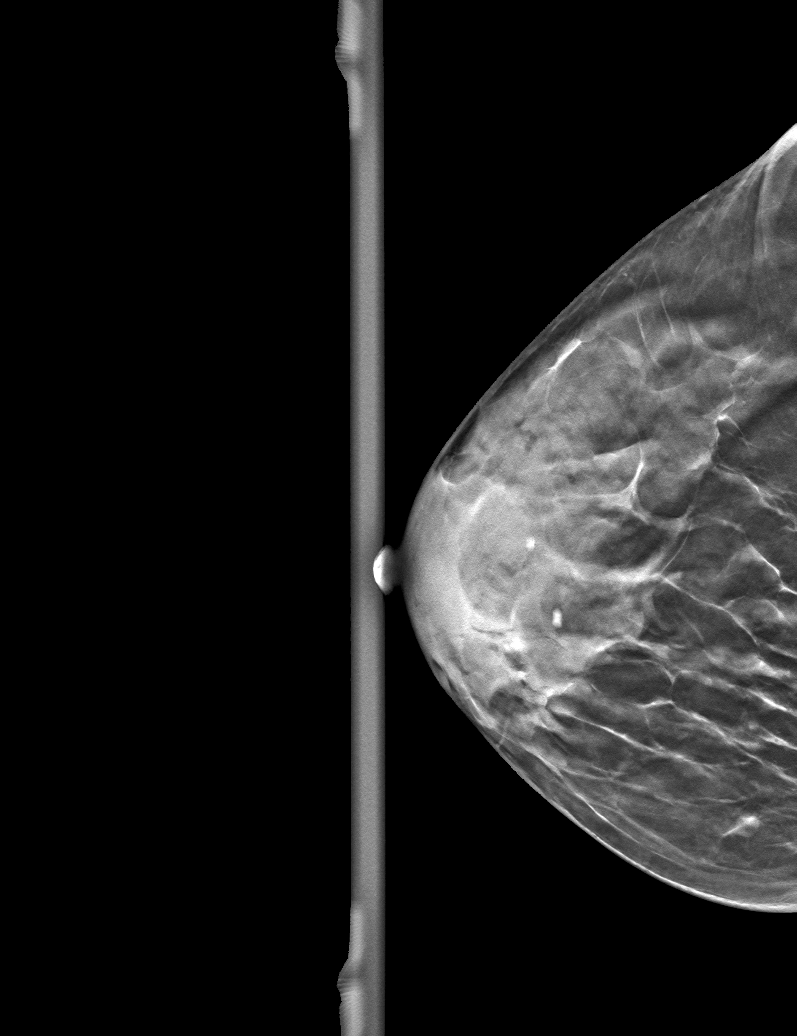

[6 of 30 positions shown; findings below may reference images not displayed]

ACR Breast Density Category d: The breast tissue is extremely dense,
which lowers the sensitivity of mammography.
FINDINGS: In the left breast, calcifications warrant further evaluation. In
the right breast, no findings suspicious for malignancy.
IMPRESSION: Further evaluation is suggested for calcifications in the left
breast.

RECOMMENDATION:
Diagnostic mammogram of the left breast. (Code:[id number]-TTH)

The patient will be contacted regarding the findings, and additional
imaging will be scheduled.

BI-RADS CATEGORY  0: Incomplete. Need additional imaging evaluation
and/or prior mammograms for comparison.

## 2024-05-11 IMAGING — MG DIGITAL DIAGNOSTIC UNILAT LEFT W/ CAD
3 series · 3 of 3 positions shown · non-contrast
Comparison: Previous exams including recent screening mammogram
dated 05/22/2021.

CLINICAL DATA: Patient returns today to evaluate LEFT breast
calcifications identified on a recent screening mammogram.

EXAM:
DIGITAL DIAGNOSTIC UNILATERAL LEFT MAMMOGRAM WITH CAD
TECHNIQUE: Left digital diagnostic mammography was performed. Mammographic
images were processed with CAD.

[L CC]
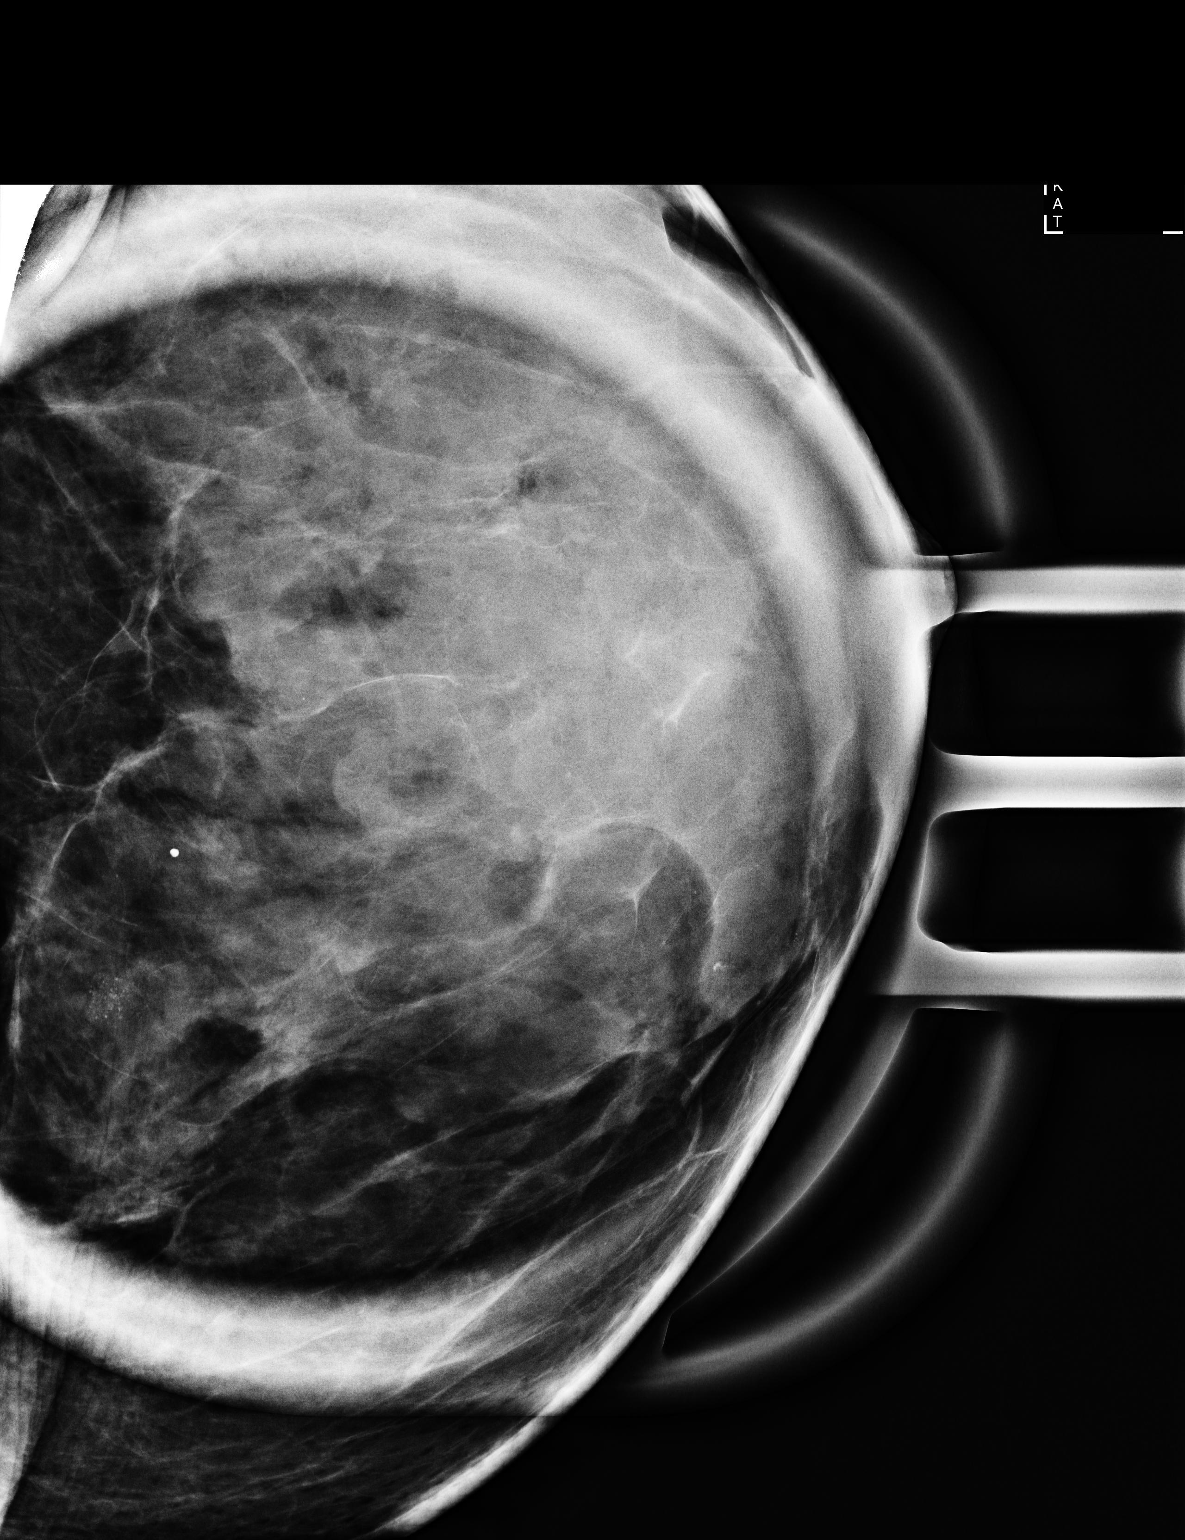

[L ML (1 of 2)]
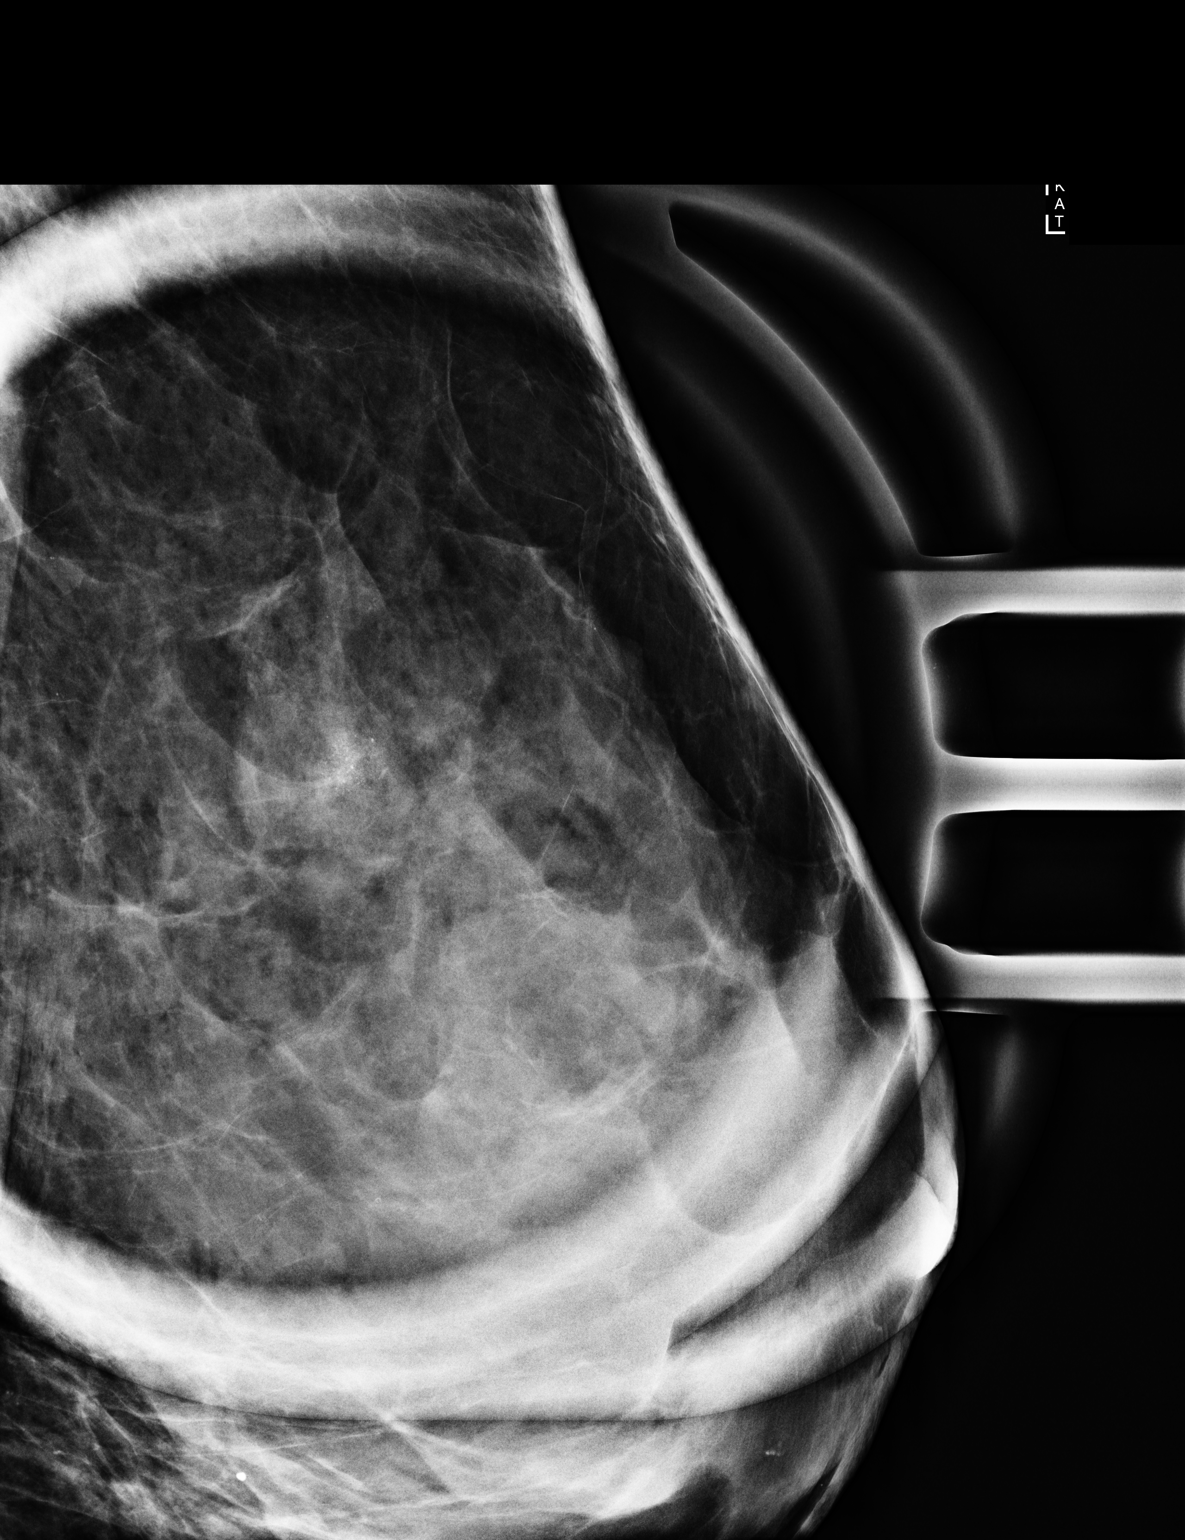

[L ML (2 of 2)]
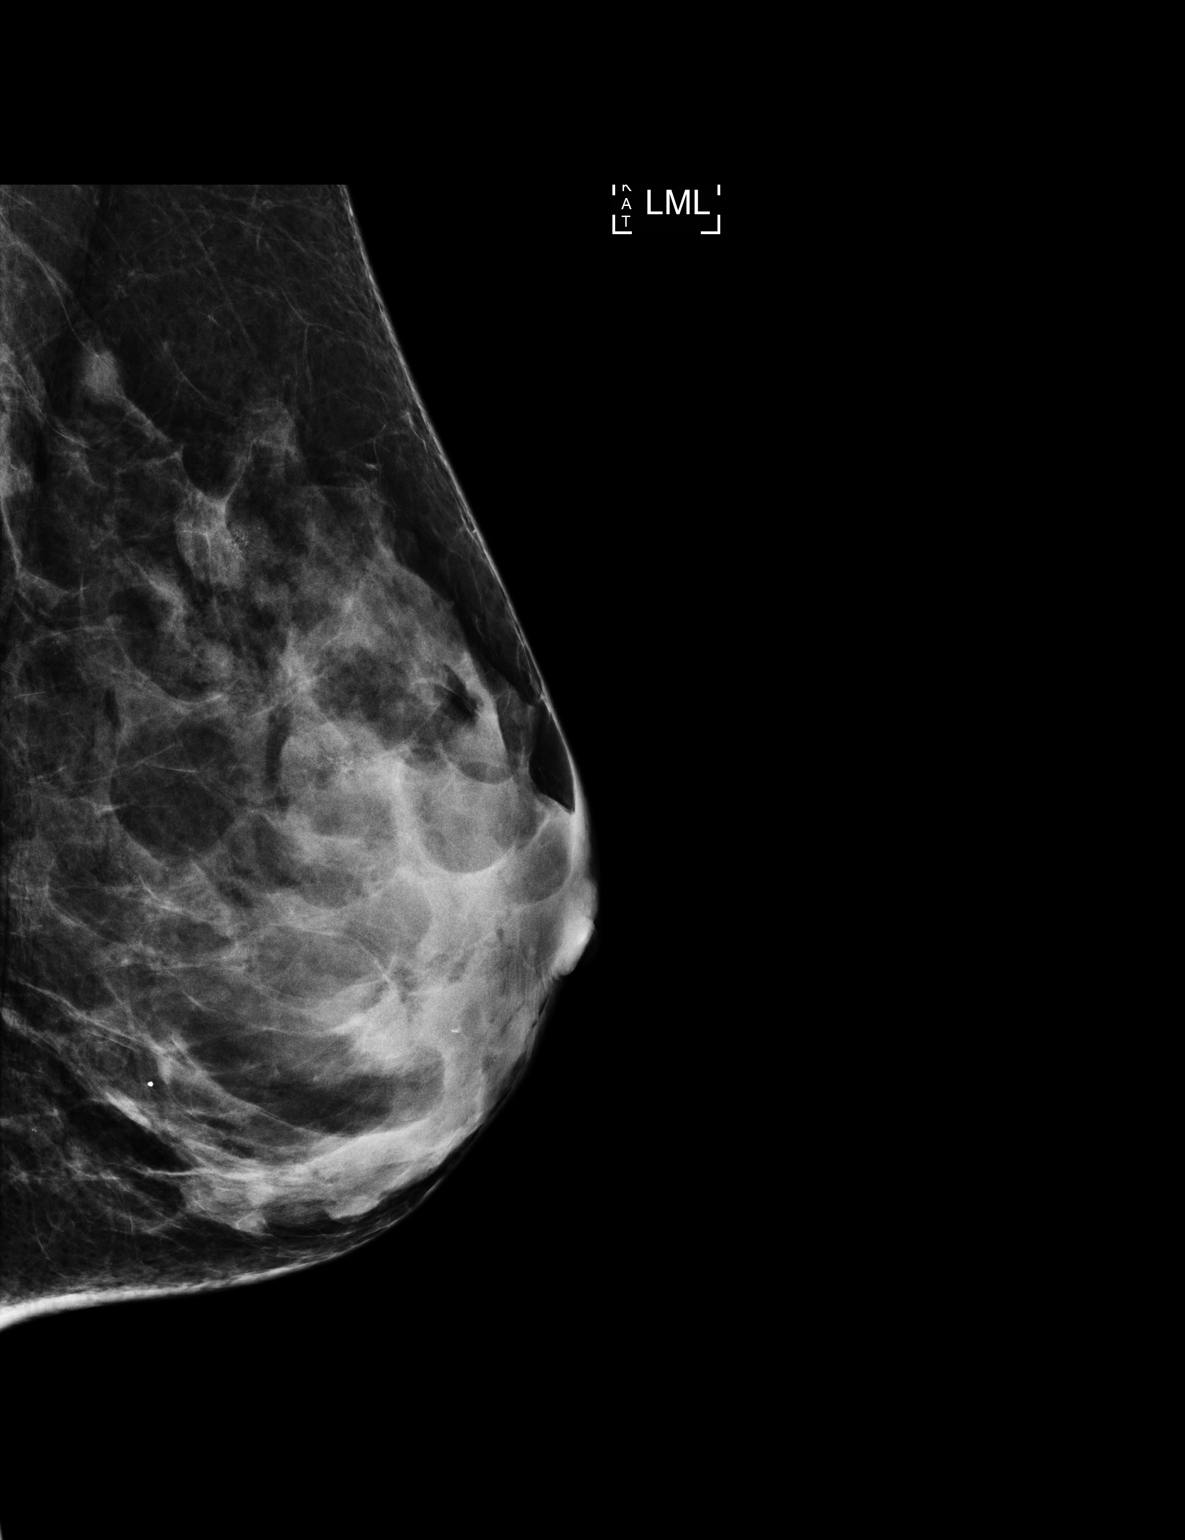

[3 of 3 positions shown; findings below may reference images not displayed]

ACR Breast Density Category d: The breast tissue is extremely dense,
which lowers the sensitivity of mammography.
FINDINGS: On today's additional diagnostic views, including magnification
views, grouped punctate and amorphous calcifications are confirmed
within the upper inner quadrant of the LEFT breast, at posterior
depth, measuring 6 mm extent.
IMPRESSION: Grouped punctate and amorphous calcifications within the upper inner
quadrant of the LEFT breast, at posterior depth, measuring 6 mm
extent. Stereotactic biopsy is recommended to exclude malignancy.

RECOMMENDATION:
Stereotactic biopsy for the grouped punctate and amorphous
calcifications located within the upper inner quadrant of the LEFT
breast at posterior depth.

Stereotactic biopsy is scheduled on [DATE]st.

I have discussed the findings and recommendations with the patient.
If applicable, a reminder letter will be sent to the patient
regarding the next appointment.

BI-RADS CATEGORY  4: Suspicious.

## 8386-09-12 DEATH — deceased
